# Patient Record
Sex: Female | Born: 1975 | Race: Black or African American | Hispanic: No | Marital: Single | Smoking: Never smoker
Health system: Southern US, Community
[De-identification: ages and names within clinical notes are randomized; demographics above are authoritative.]

## PROBLEM LIST (undated history)

## (undated) DIAGNOSIS — G47419 Narcolepsy without cataplexy: Secondary | ICD-10-CM

## (undated) DIAGNOSIS — I10 Essential (primary) hypertension: Secondary | ICD-10-CM

## (undated) DIAGNOSIS — F419 Anxiety disorder, unspecified: Secondary | ICD-10-CM

## (undated) DIAGNOSIS — C801 Malignant (primary) neoplasm, unspecified: Secondary | ICD-10-CM

## (undated) DIAGNOSIS — K219 Gastro-esophageal reflux disease without esophagitis: Secondary | ICD-10-CM

## (undated) DIAGNOSIS — F329 Major depressive disorder, single episode, unspecified: Secondary | ICD-10-CM

## (undated) DIAGNOSIS — F32A Depression, unspecified: Secondary | ICD-10-CM

## (undated) DIAGNOSIS — G47411 Narcolepsy with cataplexy: Principal | ICD-10-CM

## (undated) DIAGNOSIS — M1990 Unspecified osteoarthritis, unspecified site: Secondary | ICD-10-CM

## (undated) DIAGNOSIS — T7840XA Allergy, unspecified, initial encounter: Secondary | ICD-10-CM

## (undated) HISTORY — DX: Allergy, unspecified, initial encounter: T78.40XA

## (undated) HISTORY — PX: TONSILLECTOMY: SUR1361

## (undated) HISTORY — PX: CHOLECYSTECTOMY: SHX55

## (undated) HISTORY — PX: BREAST LUMPECTOMY: SHX2

## (undated) HISTORY — DX: Narcolepsy with cataplexy: G47.411

## (undated) HISTORY — DX: Malignant (primary) neoplasm, unspecified: C80.1

---

## 2004-11-11 ENCOUNTER — Emergency Department (HOSPITAL_COMMUNITY): Admission: EM | Admit: 2004-11-11 | Discharge: 2004-11-11 | Payer: Self-pay

## 2013-11-07 ENCOUNTER — Emergency Department (HOSPITAL_COMMUNITY)
Admission: EM | Admit: 2013-11-07 | Discharge: 2013-11-07 | Disposition: A | Discharge status: Home / Self Care | Payer: Medicare Other | Attending: Emergency Medicine | Admitting: Emergency Medicine

## 2013-11-07 ENCOUNTER — Emergency Department (HOSPITAL_COMMUNITY): Payer: Medicare Other

## 2013-11-07 ENCOUNTER — Other Ambulatory Visit: Payer: Self-pay

## 2013-11-07 ENCOUNTER — Encounter (HOSPITAL_COMMUNITY): Payer: Self-pay | Admitting: Emergency Medicine

## 2013-11-07 DIAGNOSIS — I10 Essential (primary) hypertension: Secondary | ICD-10-CM | POA: Insufficient documentation

## 2013-11-07 DIAGNOSIS — M542 Cervicalgia: Secondary | ICD-10-CM | POA: Insufficient documentation

## 2013-11-07 DIAGNOSIS — Z8669 Personal history of other diseases of the nervous system and sense organs: Secondary | ICD-10-CM | POA: Insufficient documentation

## 2013-11-07 DIAGNOSIS — Z8659 Personal history of other mental and behavioral disorders: Secondary | ICD-10-CM | POA: Insufficient documentation

## 2013-11-07 DIAGNOSIS — M25511 Pain in right shoulder: Secondary | ICD-10-CM

## 2013-11-07 DIAGNOSIS — M25519 Pain in unspecified shoulder: Secondary | ICD-10-CM | POA: Insufficient documentation

## 2013-11-07 HISTORY — DX: Depression, unspecified: F32.A

## 2013-11-07 HISTORY — DX: Narcolepsy without cataplexy: G47.419

## 2013-11-07 HISTORY — DX: Major depressive disorder, single episode, unspecified: F32.9

## 2013-11-07 HISTORY — DX: Anxiety disorder, unspecified: F41.9

## 2013-11-07 HISTORY — DX: Essential (primary) hypertension: I10

## 2013-11-07 HISTORY — DX: Unspecified osteoarthritis, unspecified site: M19.90

## 2013-11-07 MED ORDER — TRAMADOL HCL 50 MG PO TABS: 50.0000 mg | ORAL_TABLET | Freq: Four times a day (QID) | ORAL | Status: DC | PRN

## 2013-11-07 MED ORDER — HYDROMORPHONE HCL PF 1 MG/ML IJ SOLN
1.0000 mg | Freq: Once | INTRAMUSCULAR | Status: AC
  Administered 2013-11-07: 1 mg via INTRAMUSCULAR
  Filled 2013-11-07: qty 1

## 2013-11-07 MED ORDER — CYCLOBENZAPRINE HCL 10 MG PO TABS: 10.0000 mg | ORAL_TABLET | Freq: Two times a day (BID) | ORAL | Status: DC | PRN

## 2013-11-07 MED ORDER — IBUPROFEN 800 MG PO TABS: 800.0000 mg | ORAL_TABLET | Freq: Three times a day (TID) | ORAL | Status: DC

## 2013-11-07 MED ORDER — ONDANSETRON 4 MG PO TBDP
4.0000 mg | ORAL_TABLET | Freq: Once | ORAL | Status: AC
  Administered 2013-11-07: 4 mg via ORAL
  Filled 2013-11-07: qty 1

## 2013-11-07 NOTE — ED Notes (Signed)
Patient with right arm pain, it started bothering her a few days ago.  Patient states she is unable to sleep due to the pain.  Patient denies any injury to the area.  Patient denies any swelling.

## 2013-11-07 NOTE — ED Provider Notes (Signed)
CSN: [id number]     Arrival date & time 11/07/13  4098 History   First MD Initiated Contact with Patient 11/07/13 [phone]     Chief Complaint  Patient presents with  . Arm Pain   (Consider location/radiation/quality/duration/timing/severity/associated sxs/prior Treatment) HPI History provided to patient. Right shoulder pain and right neck pain worse with movement and ongoing over the last month. Symptoms seem to be getting worse. Pain radiates from her shoulder down to her wrist, is sharp in quality and moderate in severity. Tonight having trouble sleeping due to pain. She denies any trauma, fall, heavy lifting or recalled injury. No swelling. No weakness or numbness. No history of same otherwise.  Past Medical History  Diagnosis Date  . Hypertension   . Narcolepsy   . Osteoarthritis   . Anxiety   . Depression    Past Surgical History  Procedure Laterality Date  . Tonsillectomy    . Cholecystectomy    . Breast lumpectomy     No family history on file. History  Substance Use Topics  . Smoking status: Never Smoker   . Smokeless tobacco: Not on file  . Alcohol Use: No   OB History   Grav Para Term Preterm Abortions TAB SAB Ect Mult Living                 Review of Systems  Constitutional: Negative for fever and chills.  Respiratory: Negative for shortness of breath.   Cardiovascular: Negative for chest pain.  Gastrointestinal: Negative for vomiting and abdominal pain.  Genitourinary: Negative for flank pain.  Musculoskeletal: Positive for neck pain. Negative for back pain and neck stiffness.  Skin: Negative for rash.  Neurological: Negative for weakness and numbness.  All other systems reviewed and are negative.    Allergies  Review of patient's allergies indicates no known allergies.  Home Medications  No current outpatient prescriptions on file. BP 111/91  Pulse 80  Temp(Src) 97.5 F (36.4 C) (Oral)  Resp 20  SpO2 99%  LMP 08/08/2013 Physical Exam   Constitutional: She is oriented to person, place, and time. She appears well-developed and well-nourished.  HENT:  Head: Normocephalic and atraumatic.  Eyes: EOM are normal. Pupils are equal, round, and reactive to light.  Neck: Neck supple.  No C-spine tenderness or deformity. She is tender over the right trapezius without swelling  Cardiovascular: Regular rhythm and intact distal pulses.   Pulmonary/Chest: Effort normal and breath sounds normal. No respiratory distress. She exhibits no tenderness.  Abdominal: Soft. She exhibits no distension. There is no tenderness.  Musculoskeletal: Normal range of motion. She exhibits no edema.  Tender over the right shoulder without deformity. Equal grips, triceps biceps. Sensorium to light touch equal and intact throughout.  good range of motion at her shoulder with reproducible symptoms.  Neurological: She is alert and oriented to person, place, and time.  Skin: Skin is warm and dry.    ED Course  Procedures (including critical care time) Labs Review Labs Reviewed - No data to display Imaging Review Dg Cervical Spine Complete  11/07/2013   CLINICAL DATA:  Worsening right-sided neck pain for several weeks.  EXAM: CERVICAL SPINE  4+ VIEWS  COMPARISON:  Cervical spine radiographs performed 04/15/2011  FINDINGS: There is no evidence of fracture or subluxation. Vertebral bodies demonstrate normal height and alignment. Small anterior disc osteophyte complexes are seen along the lower cervical spine. Intervertebral disc spaces are preserved. Prevertebral soft tissues are within normal limits. The provided odontoid view demonstrates no  significant abnormality.  The visualized lung apices are clear.  IMPRESSION: No evidence of fracture or subluxation along the cervical spine.   Electronically Signed   By: Roanna Raider M.D.   On: 11/07/2013 04:55   Dg Shoulder Right  11/07/2013   CLINICAL DATA:  Worsening right shoulder pain for several weeks.  EXAM: RIGHT  SHOULDER - 2+ VIEW  COMPARISON:  Right shoulder radiographs performed 04/15/2011  FINDINGS: There is no evidence of fracture or dislocation. The right humeral head is seated within the glenoid fossa. The acromioclavicular joint is unremarkable in appearance. No significant soft tissue abnormalities are seen. The visualized portions of the right lung are clear.  IMPRESSION: No evidence of fracture or dislocation.   Electronically Signed   By: Roanna Raider M.D.   On: 11/07/2013 04:54    Date: 11/07/2013  Rate: 71  Rhythm: normal sinus rhythm  QRS Axis: normal  Intervals: normal  ST/T Wave abnormalities: nonspecific ST changes  Conduction Disutrbances:none  Narrative Interpretation:   Old EKG Reviewed: none available  Dilaudid IM provided. Patient requesting x-ray of her shoulder.  Sling provided.  5:36 AM recheck pain improved. plan discharge home with prescription for Motrin, Ultram. Orthopedic referral provided for any persistent symptoms. Patient agrees to rest, ice and elevation. Return precautions provided.  MDM  Diagnosis: Right shoulder pain, history of osteoarthritis  Evaluated with x-rays reviewed as above. Symptoms improved with IM narcotics Sling provided Vital signs and nurses notes reviewed and considered    Sunnie Nielsen, MD 11/07/13 [phone]

## 2013-11-07 NOTE — ED Notes (Signed)
Patient returned from xray.

## 2013-12-02 ENCOUNTER — Encounter (HOSPITAL_COMMUNITY): Payer: Self-pay | Admitting: Emergency Medicine

## 2013-12-02 ENCOUNTER — Emergency Department (HOSPITAL_COMMUNITY)
Admission: EM | Admit: 2013-12-02 | Discharge: 2013-12-02 | Disposition: A | Discharge status: Home / Self Care | Payer: Medicare Other | Attending: Emergency Medicine | Admitting: Emergency Medicine

## 2013-12-02 DIAGNOSIS — M549 Dorsalgia, unspecified: Secondary | ICD-10-CM | POA: Insufficient documentation

## 2013-12-02 DIAGNOSIS — R51 Headache: Secondary | ICD-10-CM | POA: Insufficient documentation

## 2013-12-02 DIAGNOSIS — M1990 Unspecified osteoarthritis, unspecified site: Secondary | ICD-10-CM | POA: Insufficient documentation

## 2013-12-02 DIAGNOSIS — Z8669 Personal history of other diseases of the nervous system and sense organs: Secondary | ICD-10-CM | POA: Insufficient documentation

## 2013-12-02 DIAGNOSIS — F3289 Other specified depressive episodes: Secondary | ICD-10-CM | POA: Insufficient documentation

## 2013-12-02 DIAGNOSIS — F329 Major depressive disorder, single episode, unspecified: Secondary | ICD-10-CM | POA: Insufficient documentation

## 2013-12-02 DIAGNOSIS — Z791 Long term (current) use of non-steroidal anti-inflammatories (NSAID): Secondary | ICD-10-CM | POA: Insufficient documentation

## 2013-12-02 DIAGNOSIS — M542 Cervicalgia: Secondary | ICD-10-CM | POA: Insufficient documentation

## 2013-12-02 DIAGNOSIS — F411 Generalized anxiety disorder: Secondary | ICD-10-CM | POA: Insufficient documentation

## 2013-12-02 DIAGNOSIS — Z79899 Other long term (current) drug therapy: Secondary | ICD-10-CM | POA: Insufficient documentation

## 2013-12-02 DIAGNOSIS — I10 Essential (primary) hypertension: Secondary | ICD-10-CM | POA: Insufficient documentation

## 2013-12-02 MED ORDER — OXYCODONE-ACETAMINOPHEN 5-325 MG PO TABS: 1.0000 | ORAL_TABLET | ORAL | Status: DC | PRN

## 2013-12-02 MED ORDER — OXYCODONE-ACETAMINOPHEN 5-325 MG PO TABS
1.0000 | ORAL_TABLET | Freq: Once | ORAL | Status: AC
  Administered 2013-12-02: 1 via ORAL
  Filled 2013-12-02: qty 1

## 2013-12-02 NOTE — ED Notes (Signed)
Pt states that she is experiencing neck pain and back pain. Pt states that this is chronic pain. States she took Tylenol Arthritis and tried a heating pad with no decrease in pain.

## 2013-12-02 NOTE — ED Provider Notes (Signed)
CSN: [id number]     Arrival date & time 12/02/13  0305 History   First MD Initiated Contact with Patient 12/02/13 0532     Chief Complaint  Patient presents with  . Neck Pain  . Back Pain    Patient is a 37 y.o. female presenting with neck pain and back pain. The history is provided by the patient.  Neck Pain Pain location:  R side Quality:  Stiffness Pain radiates to:  R shoulder Pain severity:  Moderate Onset quality:  Gradual Timing:  Constant Progression:  Worsening Chronicity:  Recurrent Worsened by:  Position Ineffective treatments:  Heat Associated symptoms: headaches   Associated symptoms: no chest pain, no fever, no numbness and no weakness   Back Pain Associated symptoms: headaches   Associated symptoms: no chest pain, no fever, no numbness and no weakness   pt reports that she has had right sided neck pain/stiffness for one day It will move into back and shoulder No fever.  No trauma.  No weakness No cp/sob She has had this previously and seen in the ED before No h/o neck surgery   Past Medical History  Diagnosis Date  . Hypertension   . Narcolepsy   . Osteoarthritis   . Anxiety   . Depression    Past Surgical History  Procedure Laterality Date  . Tonsillectomy    . Cholecystectomy    . Breast lumpectomy     History reviewed. No pertinent family history. History  Substance Use Topics  . Smoking status: Never Smoker   . Smokeless tobacco: Not on file  . Alcohol Use: Yes     Comment: ocassionally   OB History   Grav Para Term Preterm Abortions TAB SAB Ect Mult Living                 Review of Systems  Constitutional: Negative for fever.  HENT: Negative for trouble swallowing.   Cardiovascular: Negative for chest pain.  Gastrointestinal: Negative for vomiting.  Musculoskeletal: Positive for back pain and neck pain.  Neurological: Positive for headaches. Negative for weakness and numbness.    Allergies  Review of patient's allergies  indicates no known allergies.  Home Medications   Current Outpatient Rx  Name  Route  Sig  Dispense  Refill  . amLODipine (NORVASC) 10 MG tablet   Oral   Take 10 mg by mouth daily.         . Armodafinil (NUVIGIL) 250 MG tablet   Oral   Take 250 mg by mouth daily.         Marland Kitchen atenolol (TENORMIN) 50 MG tablet   Oral   Take 50 mg by mouth daily.         Marland Kitchen buPROPion (WELLBUTRIN XL) 150 MG 24 hr tablet   Oral   Take 150 mg by mouth daily.         . celecoxib (CELEBREX) 200 MG capsule   Oral   Take 200 mg by mouth daily.         . Cholecalciferol (VITAMIN D PO)   Oral   Take 1 tablet by mouth daily.         . cyclobenzaprine (FLEXERIL) 10 MG tablet   Oral   Take 1 tablet (10 mg total) by mouth 2 (two) times daily as needed for muscle spasms.   20 tablet   0   . esomeprazole (NEXIUM) 40 MG capsule   Oral   Take 40 mg by mouth 2 (two)  times daily before a meal.         . ibuprofen (ADVIL,MOTRIN) 800 MG tablet   Oral   Take 1 tablet (800 mg total) by mouth 3 (three) times daily.   21 tablet   0   . losartan (COZAAR) 100 MG tablet   Oral   Take 100 mg by mouth daily.         Marland Kitchen oxyCODONE-acetaminophen (PERCOCET/ROXICET) 5-325 MG per tablet   Oral   Take 1 tablet by mouth every 4 (four) hours as needed for severe pain.   5 tablet   0    BP 134/76  Pulse 69  Temp(Src) 97.6 F (36.4 C) (Oral)  Resp 16  SpO2 100%  LMP 11/30/2013 Physical Exam CONSTITUTIONAL: Well developed/well nourished HEAD: Normocephalic/atraumatic EYES: EOMI/PERRL ENMT: Mucous membranes moist, no trismus NECK: supple no meningeal signs, no bruits noted SPINE:entire spine nontender, No bruising/crepitance/stepoffs noted to spine Cervical paraspinal tenderness is reproduced with rotation of neck . No erythema/masses noted.   CV: S1/S2 noted, no murmurs/rubs/gallops noted LUNGS: Lungs are clear to auscultation bilaterally, no apparent distress ABDOMEN: soft, nontender, no  rebound or guarding NEURO: Pt is awake/alert, moves all extremitiesx4 Equal power with hand grip, wrist flex/extension, elbow flex/extension, and equal power with shoulder abduction/adduction.  No focal sensory deficit is noted in either UE.   Equal biceps/brachioradial reflex in bilateral UE EXTREMITIES: pulses normal, full ROM SKIN: warm, color normal PSYCH: no abnormalities of mood noted  ED Course  Procedures (including critical care time)  Pt well appearing, no distress noted, no focal neuro deficits noted She has had imaging on previous ED visit She still has flexeril at home  She requests pain management Short course of percocet given She was advised to f/u as outpatient if this continues We discussed strict return precautions  Labs Review Labs Reviewed - No data to display Imaging Review No results found.  EKG Interpretation   None       MDM   1. Neck pain    Nursing notes including past medical history and social history reviewed and considered in documentation Previous records reviewed and considered     Joya Gaskins, MD 12/02/13 [phone]

## 2013-12-02 NOTE — ED Notes (Signed)
Pt upset about wait time - explained ED process and that provider will be in as soon as possible.

## 2014-03-03 ENCOUNTER — Encounter (HOSPITAL_COMMUNITY): Payer: Self-pay | Admitting: Emergency Medicine

## 2014-03-03 ENCOUNTER — Emergency Department (HOSPITAL_COMMUNITY): Payer: Medicare Other

## 2014-03-03 ENCOUNTER — Emergency Department (HOSPITAL_COMMUNITY)
Admission: EM | Admit: 2014-03-03 | Discharge: 2014-03-03 | Disposition: A | Discharge status: Home / Self Care | Payer: Medicare Other | Attending: Emergency Medicine | Admitting: Emergency Medicine

## 2014-03-03 DIAGNOSIS — Z8669 Personal history of other diseases of the nervous system and sense organs: Secondary | ICD-10-CM | POA: Insufficient documentation

## 2014-03-03 DIAGNOSIS — Z853 Personal history of malignant neoplasm of breast: Secondary | ICD-10-CM | POA: Insufficient documentation

## 2014-03-03 DIAGNOSIS — M25569 Pain in unspecified knee: Secondary | ICD-10-CM | POA: Insufficient documentation

## 2014-03-03 DIAGNOSIS — K6289 Other specified diseases of anus and rectum: Secondary | ICD-10-CM | POA: Insufficient documentation

## 2014-03-03 DIAGNOSIS — F329 Major depressive disorder, single episode, unspecified: Secondary | ICD-10-CM | POA: Insufficient documentation

## 2014-03-03 DIAGNOSIS — Z3202 Encounter for pregnancy test, result negative: Secondary | ICD-10-CM | POA: Insufficient documentation

## 2014-03-03 DIAGNOSIS — A499 Bacterial infection, unspecified: Secondary | ICD-10-CM | POA: Insufficient documentation

## 2014-03-03 DIAGNOSIS — K219 Gastro-esophageal reflux disease without esophagitis: Secondary | ICD-10-CM | POA: Insufficient documentation

## 2014-03-03 DIAGNOSIS — F3289 Other specified depressive episodes: Secondary | ICD-10-CM | POA: Insufficient documentation

## 2014-03-03 DIAGNOSIS — F411 Generalized anxiety disorder: Secondary | ICD-10-CM | POA: Insufficient documentation

## 2014-03-03 DIAGNOSIS — R3915 Urgency of urination: Secondary | ICD-10-CM | POA: Insufficient documentation

## 2014-03-03 DIAGNOSIS — M25561 Pain in right knee: Secondary | ICD-10-CM

## 2014-03-03 DIAGNOSIS — N760 Acute vaginitis: Secondary | ICD-10-CM | POA: Insufficient documentation

## 2014-03-03 DIAGNOSIS — B9689 Other specified bacterial agents as the cause of diseases classified elsewhere: Secondary | ICD-10-CM | POA: Insufficient documentation

## 2014-03-03 DIAGNOSIS — M1990 Unspecified osteoarthritis, unspecified site: Secondary | ICD-10-CM | POA: Insufficient documentation

## 2014-03-03 DIAGNOSIS — Z79899 Other long term (current) drug therapy: Secondary | ICD-10-CM | POA: Insufficient documentation

## 2014-03-03 DIAGNOSIS — K648 Other hemorrhoids: Secondary | ICD-10-CM | POA: Insufficient documentation

## 2014-03-03 DIAGNOSIS — I10 Essential (primary) hypertension: Secondary | ICD-10-CM | POA: Insufficient documentation

## 2014-03-03 DIAGNOSIS — R110 Nausea: Secondary | ICD-10-CM | POA: Insufficient documentation

## 2014-03-03 DIAGNOSIS — M79609 Pain in unspecified limb: Secondary | ICD-10-CM

## 2014-03-03 HISTORY — DX: Gastro-esophageal reflux disease without esophagitis: K21.9

## 2014-03-03 LAB — CBC WITH DIFFERENTIAL/PLATELET
Basophils Absolute: 0 10*3/uL (ref 0.0–0.1)
Basophils Relative: 0 % (ref 0–1)
Eosinophils Absolute: 0.1 10*3/uL (ref 0.0–0.7)
Eosinophils Relative: 1 % (ref 0–5)
HCT: 34.9 % — ABNORMAL LOW (ref 36.0–46.0)
Hemoglobin: 11.6 g/dL — ABNORMAL LOW (ref 12.0–15.0)
Lymphocytes Relative: 39 % (ref 12–46)
Lymphs Abs: 2.8 10*3/uL (ref 0.7–4.0)
MCH: 27.7 pg (ref 26.0–34.0)
MCHC: 33.2 g/dL (ref 30.0–36.0)
MCV: 83.3 fL (ref 78.0–100.0)
Monocytes Absolute: 0.5 10*3/uL (ref 0.1–1.0)
Monocytes Relative: 7 % (ref 3–12)
Neutro Abs: 3.9 10*3/uL (ref 1.7–7.7)
Neutrophils Relative %: 53 % (ref 43–77)
Platelets: 280 10*3/uL (ref 150–400)
RBC: 4.19 MIL/uL (ref 3.87–5.11)
RDW: 12.9 % (ref 11.5–15.5)
WBC: 7.3 10*3/uL (ref 4.0–10.5)

## 2014-03-03 LAB — BASIC METABOLIC PANEL
BUN: 15 mg/dL (ref 6–23)
CO2: 26 mEq/L (ref 19–32)
Calcium: 8.8 mg/dL (ref 8.4–10.5)
Chloride: 105 mEq/L (ref 96–112)
Creatinine, Ser: 0.84 mg/dL (ref 0.50–1.10)
GFR calc Af Amer: >90 mL/min (ref >90)
GFR calc non Af Amer: 87 mL/min — ABNORMAL LOW (ref >90)
Glucose, Bld: 92 mg/dL (ref 70–99)
Potassium: 3.9 mEq/L (ref 3.7–5.3)
Sodium: 141 mEq/L (ref 137–147)

## 2014-03-03 LAB — WET PREP, GENITAL
Trich, Wet Prep: NONE SEEN
WBC, Wet Prep HPF POC: NONE SEEN
Yeast Wet Prep HPF POC: NONE SEEN

## 2014-03-03 LAB — URINALYSIS, ROUTINE W REFLEX MICROSCOPIC
Bilirubin Urine: NEGATIVE
Glucose, UA: NEGATIVE mg/dL
Hgb urine dipstick: NEGATIVE
Ketones, ur: NEGATIVE mg/dL
Leukocytes, UA: NEGATIVE
Nitrite: NEGATIVE
Protein, ur: NEGATIVE mg/dL
Specific Gravity, Urine: 1.033 — ABNORMAL HIGH (ref 1.005–1.030)
Urobilinogen, UA: 0.2 mg/dL (ref 0.0–1.0)
pH: 6 (ref 5.0–8.0)

## 2014-03-03 LAB — POC URINE PREG, ED: Preg Test, Ur: NEGATIVE

## 2014-03-03 LAB — PREGNANCY, URINE: Preg Test, Ur: NEGATIVE

## 2014-03-03 MED ORDER — ONDANSETRON 4 MG PO TBDP
8.0000 mg | ORAL_TABLET | Freq: Once | ORAL | Status: AC
  Administered 2014-03-03: 8 mg via ORAL
  Filled 2014-03-03: qty 2

## 2014-03-03 MED ORDER — OXYCODONE-ACETAMINOPHEN 5-325 MG PO TABS
2.0000 | ORAL_TABLET | Freq: Once | ORAL | Status: AC
  Administered 2014-03-03: 2 via ORAL
  Filled 2014-03-03: qty 2

## 2014-03-03 MED ORDER — TRAMADOL HCL 50 MG PO TABS: 50.0000 mg | ORAL_TABLET | Freq: Four times a day (QID) | ORAL | Status: DC | PRN

## 2014-03-03 MED ORDER — METRONIDAZOLE 500 MG PO TABS
500.0000 mg | ORAL_TABLET | Freq: Two times a day (BID) | ORAL | Status: DC
Start: 2014-03-03 — End: 2014-04-12

## 2014-03-03 MED ORDER — HYDROCORTISONE ACETATE 25 MG RE SUPP: 25.0000 mg | Freq: Two times a day (BID) | RECTAL | Status: DC

## 2014-03-03 NOTE — Discharge Instructions (Signed)
Bacterial Vaginosis Bacterial vaginosis is an infection of the vagina. It happens when too many of certain germs (bacteria) grow in the vagina. HOME CARE  Take your medicine as told by your doctor.  Finish your medicine even if you start to feel better.  Do not have sex until you finish your medicine and are better.  Tell your sex partner that you have an infection. They should see their doctor for treatment.  Practice safe sex. Use condoms. Have only one sex partner. GET HELP IF:  You are not getting better after 3 days of treatment.  You have more grey fluid (discharge) coming from your vagina than before.  You have more pain than before.  You have a fever. MAKE SURE YOU:   Understand these instructions.  Will watch your condition.  Will get help right away if you are not doing well or get worse. Document Released: 08/28/2008 Document Revised: 09/09/2013 Document Reviewed: 07/01/2013 [employer] Patient Information 2014 [employer].  

## 2014-03-03 NOTE — Progress Notes (Signed)
Right lower extremity venous duplex completed.  Right:  No evidence of DVT, superficial thrombosis, or Baker's cyst.  Left:  Negative for DVT in the common femoral vein.  

## 2014-03-03 NOTE — ED Provider Notes (Signed)
Medical Screening Exam   Pt placed in fast track for right knee pain, but also is complaining of rectal and lower abdominal pain.  Tender across lower abdomen.  No guarding, no rebound. Pt to be moved to main ED.      Clayton Bibles, PA-C 03/03/14 1456

## 2014-03-03 NOTE — ED Notes (Signed)
PA ordered pt moved to acute side.

## 2014-03-03 NOTE — ED Notes (Signed)
Vascular at bedside completing dopplar

## 2014-03-03 NOTE — ED Notes (Signed)
Rt knee pain since last night denies injury and rectal pain was told she has internal hemorrhoids denies drainage

## 2014-03-03 NOTE — ED Notes (Signed)
C/O right knee pain since yesterday. "Feels like it's slipping". Also, c/o rectal pain, "I've been told I have internal hemorrhoids". Denies bleeding.

## 2014-03-03 NOTE — ED Provider Notes (Signed)
CSN: [id number]     Arrival date & time 03/03/14  1400 History   First MD Initiated Contact with Patient 03/03/14 1447     Chief Complaint  Patient presents with  . Knee Pain  . Rectal Pain     (Consider location/radiation/quality/duration/timing/severity/associated sxs/prior Treatment) HPI Comments: Patient is a 38 year old female with history of hypertension, narcolepsy, osteoarthritis, anxiety, depression, acid reflux, and breast cancer who presents today with rectal pain and right knee pain. She was walking yesterday when she felt her knee "slip". Since that time she has had pain laterally in her right knee. Walking seems to make the pain worse. She has not tried anything to make the pain better. She has had rectal pain since earlier today. It is a sharp pain that radiates from her anus to her rectum. At her last colonoscopy she was told she has internal hemorrhoids and is not sure if this is what is causing her pain. She admits to sitting on the toilet and straining for long periods of time. She denies BRBPR and melena. Bowel movements do not make this pain worse. She has associated nausea. She denies fevers, chills, vomiting, vaginal discharge, vaginal bleeding.   The history is provided by the patient. No language interpreter was used.    Past Medical History  Diagnosis Date  . Hypertension   . Narcolepsy   . Osteoarthritis   . Anxiety   . Depression   . Acid reflux    Past Surgical History  Procedure Laterality Date  . Tonsillectomy    . Cholecystectomy    . Breast lumpectomy     No family history on file. History  Substance Use Topics  . Smoking status: Never Smoker   . Smokeless tobacco: Not on file  . Alcohol Use: Yes     Comment: ocassionally   OB History   Grav Para Term Preterm Abortions TAB SAB Ect Mult Living                 Review of Systems  Constitutional: Negative for fever and chills.  Respiratory: Negative for shortness of breath.   Cardiovascular:  Negative for chest pain.  Gastrointestinal: Positive for nausea and abdominal pain. Negative for vomiting, diarrhea, constipation, blood in stool and anal bleeding.       Increased straining  Genitourinary: Positive for urgency. Negative for dysuria.  All other systems reviewed and are negative.      Allergies  Review of patient's allergies indicates no known allergies.  Home Medications   Current Outpatient Rx  Name  Route  Sig  Dispense  Refill  . acetaminophen (TYLENOL) 650 MG CR tablet   Oral   Take 650 mg by mouth every 8 (eight) hours as needed for pain.         Marland Kitchen amLODipine (NORVASC) 10 MG tablet   Oral   Take 10 mg by mouth daily.         . Armodafinil (NUVIGIL) 250 MG tablet   Oral   Take 250 mg by mouth daily.         Marland Kitchen atenolol (TENORMIN) 50 MG tablet   Oral   Take 50 mg by mouth daily.         Marland Kitchen buPROPion (WELLBUTRIN XL) 150 MG 24 hr tablet   Oral   Take 150 mg by mouth daily.         . celecoxib (CELEBREX) 200 MG capsule   Oral   Take 200 mg by  mouth daily.         . Cholecalciferol (VITAMIN D PO)   Oral   Take 1 tablet by mouth daily.         . cycloSPORINE (RESTASIS) 0.05 % ophthalmic emulsion   Both Eyes   Place 1 drop into both eyes 2 (two) times daily.         Marland Kitchen esomeprazole (NEXIUM) 40 MG capsule   Oral   Take 40 mg by mouth 2 (two) times daily before a meal.         . losartan (COZAAR) 100 MG tablet   Oral   Take 100 mg by mouth daily.         . Multiple Vitamins-Minerals (MULTIVITAMIN WITH MINERALS) tablet   Oral   Take 1 tablet by mouth daily.         . traZODone (DESYREL) 50 MG tablet   Oral   Take 50 mg by mouth at bedtime as needed for sleep.          BP 156/90  Pulse 78  Temp(Src) 98.4 F (36.9 C) (Oral)  Resp 20  SpO2 98% Physical Exam  Nursing note and vitals reviewed. Constitutional: She is oriented to person, place, and time. She appears well-developed and well-nourished. No distress.   HENT:  Head: Normocephalic and atraumatic.  Right Ear: External ear normal.  Left Ear: External ear normal.  Nose: Nose normal.  Mouth/Throat: Oropharynx is clear and moist.  Eyes: Conjunctivae are normal.  Neck: Normal range of motion.  Cardiovascular: Normal rate, regular rhythm and normal heart sounds.   Pulmonary/Chest: Effort normal and breath sounds normal. No stridor. No respiratory distress. She has no wheezes. She has no rales.  Abdominal: Soft. Bowel sounds are normal. She exhibits no distension. There is no tenderness. There is no rebound.  Genitourinary: There is no rash, tenderness, lesion or injury on the right labia. There is no rash, tenderness, lesion or injury on the left labia. Cervix exhibits discharge. Cervix exhibits no motion tenderness and no friability. Right adnexum displays no mass, no tenderness and no fullness. Left adnexum displays tenderness (mild). Left adnexum displays no mass and no fullness. No erythema, tenderness or bleeding around the vagina. No foreign body around the vagina. No signs of injury around the vagina. Vaginal discharge found.  White vaginal discharge  Musculoskeletal: Normal range of motion.  Tender to palpation over lateral aspect of right knee   Neurological: She is alert and oriented to person, place, and time. She has normal strength.  Skin: Skin is warm and dry. She is not diaphoretic. No erythema.  Psychiatric: She has a normal mood and affect. Her behavior is normal.    ED Course  Procedures (including critical care time) Labs Review Labs Reviewed  WET PREP, GENITAL - Abnormal; Notable for the following:    Clue Cells Wet Prep HPF POC FEW (*)    All other components within normal limits  CBC WITH DIFFERENTIAL - Abnormal; Notable for the following:    Hemoglobin 11.6 (*)    HCT 34.9 (*)    All other components within normal limits  BASIC METABOLIC PANEL - Abnormal; Notable for the following:    GFR calc non Af Amer 87 (*)     All other components within normal limits  URINALYSIS, ROUTINE W REFLEX MICROSCOPIC - Abnormal; Notable for the following:    APPearance CLOUDY (*)    Specific Gravity, Urine 1.033 (*)    All other components within normal  limits  GC/CHLAMYDIA PROBE AMP  PREGNANCY, URINE  POC URINE PREG, ED   Imaging Review Dg Knee Complete 4 Views Right  03/03/2014   CLINICAL DATA:  Right need weakness.  EXAM: RIGHT KNEE - COMPLETE 4+ VIEW  COMPARISON:  None.  FINDINGS: No fracture. No bone lesion. There is narrowing of the patellofemoral joint space compartment. Marginal osteophytes are noted from all 3 compartments. There is ossification posterior to the knee most likely in the soft tissues. This could be in a Baker cyst.  No joint effusion.  Soft tissues are otherwise unremarkable.  IMPRESSION: No fracture or acute finding.  Degenerative changes most evident along the patellofemoral joint space compartment.   Electronically Signed   By: Lajean Manes M.D.   On: 03/03/2014 16:18     EKG Interpretation None      MDM   Final diagnoses:  Right knee pain  Bacterial vaginosis  Rectal pain   Patient presents to ED with right knee pain, lower abdominal pain, and rectal pain. Her right knee feels unstable after walking. Patient has not fallen. XR shows no acute abnormality, venous duplex shows no DVT or Baker's cyst. Patient is neurovascularly intact, compartment soft, joint stable. Patient given knee sleeve and encouraged RICE. Patient with internal hemorrhoids. She was given Anusol and encouraged to stop straining. There is no evidence on exam of perianal or perirectal abscess. Patient has lower abdominal discomfort. She was found to have few clue cells on wet prep. Will tx for BV. No CMT on pelvic exam. No concern for PID, ectopic pregnancy, or ovarian torsion. Discussed reasons to return to the ED. Vital signs stable for discharge. Discussed case with Dr. Doy Mince who agrees with plan. Patient / Family /  Caregiver informed of clinical course, understand medical decision-making process, and agree with plan.     Elwyn Lade, PA-C 03/04/14 [phone]

## 2014-03-04 LAB — GC/CHLAMYDIA PROBE AMP
CT Probe RNA: NEGATIVE
GC Probe RNA: NEGATIVE

## 2014-03-05 NOTE — ED Provider Notes (Signed)
Medical screening examination/treatment/procedure(s) were performed by non-physician practitioner and as supervising physician I was immediately available for consultation/collaboration.   Houston Siren III, MD 03/05/14 [phone]

## 2014-03-05 NOTE — ED Provider Notes (Signed)
Medical screening examination/treatment/procedure(s) were performed by non-physician practitioner and as supervising physician I was immediately available for consultation/collaboration.   EKG Interpretation None        Mervin Kung, MD 03/05/14 1335

## 2014-04-12 ENCOUNTER — Encounter: Payer: Self-pay | Admitting: Obstetrics & Gynecology

## 2014-04-12 ENCOUNTER — Ambulatory Visit (INDEPENDENT_AMBULATORY_CARE_PROVIDER_SITE_OTHER): Payer: Medicare Other | Admitting: Obstetrics & Gynecology

## 2014-04-12 VITALS — BP 133/81 | HR 76 | Temp 98.0°F | Ht 64.0 in | Wt 244.0 lb

## 2014-04-12 DIAGNOSIS — N951 Menopausal and female climacteric states: Secondary | ICD-10-CM

## 2014-04-13 ENCOUNTER — Encounter: Payer: Self-pay | Admitting: Obstetrics & Gynecology

## 2014-04-14 ENCOUNTER — Other Ambulatory Visit: Payer: Self-pay | Admitting: *Deleted

## 2014-04-14 DIAGNOSIS — E288 Other ovarian dysfunction: Principal | ICD-10-CM

## 2014-04-14 DIAGNOSIS — E2839 Other primary ovarian failure: Secondary | ICD-10-CM

## 2014-04-14 NOTE — Progress Notes (Signed)
Patient ID: Robin Russo, female   DOB: 1975/12/18, 38 y.o.   MRN: [id number]  C/O menopausal symptoms  HPI Robin Russo is a 38 y.o. female   Other This is a chronic problem. Episode onset: She has a h/o breast cancer.  The hot flushes started after chemotherapy. The problem occurs 2 to 4 times per day. The problem has been unchanged. Associated symptoms comments: Vaginal dryness. Treatments tried: ssri/snri.         Past Medical History  Diagnosis Date  . Hypertension   . Narcolepsy   . Osteoarthritis   . Anxiety   . Depression   . Acid reflux   . Allergy     Past Surgical History  Procedure Laterality Date  . Tonsillectomy    . Cholecystectomy    . Breast lumpectomy      Family History  Problem Relation Age of Onset  . Heart disease Mother   . Diabetes Mother   . Hypertension Mother   . Hyperlipidemia Mother   . Cataracts Mother   . Stroke Mother   . Arthritis Mother   . Heart disease Father   . Hypertension Father   . Hyperlipidemia Father   . Diabetes Father   . Stroke Father   . Arthritis Father   . Cataracts Father     Social History History  Substance Use Topics  . Smoking status: Never Smoker   . Smokeless tobacco: Never Used  . Alcohol Use: Yes     Comment: ocassionally    No Known Allergies  Current Outpatient Prescriptions  Medication Sig Dispense Refill  . acetaminophen (TYLENOL) 650 MG CR tablet Take 650 mg by mouth every 8 (eight) hours as needed for pain.      Marland Kitchen ALPRAZolam (XANAX) 0.25 MG tablet Take 0.25 mg by mouth at bedtime as needed for anxiety.      Marland Kitchen amLODipine (NORVASC) 10 MG tablet Take 10 mg by mouth daily.      . Armodafinil (NUVIGIL) 250 MG tablet Take 250 mg by mouth daily.      Marland Kitchen atenolol (TENORMIN) 50 MG tablet Take 50 mg by mouth daily.      Marland Kitchen buPROPion (WELLBUTRIN XL) 150 MG 24 hr tablet Take 150 mg by mouth daily.      . celecoxib (CELEBREX) 200 MG capsule Take 200 mg by mouth daily.      .  Cholecalciferol (VITAMIN D PO) Take 1 tablet by mouth daily.      . cycloSPORINE (RESTASIS) 0.05 % ophthalmic emulsion Place 1 drop into both eyes 2 (two) times daily.      Marland Kitchen esomeprazole (NEXIUM) 40 MG capsule Take 40 mg by mouth 2 (two) times daily before a meal.      . HYDROcodone-acetaminophen (NORCO/VICODIN) 5-325 MG per tablet Take 1 tablet by mouth every 6 (six) hours as needed for moderate pain.      Marland Kitchen ibuprofen (ADVIL,MOTRIN) 800 MG tablet Take 800 mg by mouth every 8 (eight) hours as needed.      Marland Kitchen losartan (COZAAR) 100 MG tablet Take 100 mg by mouth daily.      . metFORMIN (GLUCOPHAGE) 500 MG tablet Take by mouth 2 (two) times daily with a meal.      . Multiple Vitamins-Minerals (MULTIVITAMIN WITH MINERALS) tablet Take 1 tablet by mouth daily.      . Salicylic Acid-Cleanser 6 % (CREAM) KIT Apply topically.      . traMADol (ULTRAM) 50 MG tablet Take 1  tablet (50 mg total) by mouth every 6 (six) hours as needed.  15 tablet  0  . traZODone (DESYREL) 50 MG tablet Take 50 mg by mouth at bedtime as needed for sleep.       No current facility-administered medications for this visit.    Review of Systems Review of Systems Constitutional: negative for fatigue and weight loss Respiratory: negative for cough and wheezing Cardiovascular: negative for chest pain, fatigue and palpitations Gastrointestinal: negative for abdominal pain and change in bowel habits Genitourinary:positive for vaginal dryness Musculoskeletal:negative for myalgias Neurological: negative for gait problems and tremors Behavioral/Psych: negative for abusive relationship, depression Endocrine: positive for temperature intolerance     Blood pressure 133/81, pulse 76, temperature 98 F (36.7 C), height 5' 4"  (1.626 m), weight 110.678 kg (244 lb), last menstrual period 03/23/2014.  Physical Exam Physical Exam   50% of 15 min visit spent on counseling and coordination of care.   Data Reviewed None  Assessment     Menopausal symptoms--POF s/p chemotherapy    Plan   Non-hormonal treatments reviewed--unable to tolerate anti seizure medications, vaginal moisturizers, water soluble lubricants, behavioral modification Maintain bone health No orders of the defined types were placed in this encounter.   Meds ordered this encounter  Medications  . Salicylic Acid-Cleanser 6 % (CREAM) KIT    Sig: Apply topically.  Marland Kitchen ibuprofen (ADVIL,MOTRIN) 800 MG tablet    Sig: Take 800 mg by mouth every 8 (eight) hours as needed.  . metFORMIN (GLUCOPHAGE) 500 MG tablet    Sig: Take by mouth 2 (two) times daily with a meal.  . ALPRAZolam (XANAX) 0.25 MG tablet    Sig: Take 0.25 mg by mouth at bedtime as needed for anxiety.  Marland Kitchen HYDROcodone-acetaminophen (NORCO/VICODIN) 5-325 MG per tablet    Sig: Take 1 tablet by mouth every 6 (six) hours as needed for moderate pain.   BMD Follow up as needed.         Lahoma Crocker 04/15/2014, 12:10 AM

## 2014-04-15 DIAGNOSIS — N951 Menopausal and female climacteric states: Secondary | ICD-10-CM | POA: Insufficient documentation

## 2014-04-15 NOTE — Patient Instructions (Signed)
Bone Health Our bones do many things. They provide structure, protect organs, anchor muscles, and store calcium. Adequate calcium in your diet and weight-bearing physical activity help build strong bones, improve bone amounts, and may reduce the risk of weakening of bones (osteoporosis) later in life. PEAK BONE MASS By age 39, the average woman has acquired most of her skeletal bone mass. A large decline occurs in older adults which increases the risk of osteoporosis. In women this occurs around the time of menopause. It is important for young girls to reach their peak bone mass in order to maintain bone health throughout life. A person with high bone mass as a young adult will be more likely to have a higher bone mass later in life. Not enough calcium consumption and physical activity early on could result in a failure to achieve optimum bone mass in adulthood. OSTEOPOROSIS Osteoporosis is a disease of the bones. It is defined as low bone mass with deterioration of bone structure. Osteoporosis leads to an increase risk of fractures with falls. These fractures commonly happen in the wrist, hip, and spine. While men and women of all ages and background can develop osteoporosis, some of the risk factors for osteoporosis are:  Female.  White.  Postmenopausal.  Older adults.  Small in body size.  Eating a diet low in calcium.  Physically inactive.  Smoking.  Use of some medications.  Family history. CALCIUM Calcium is a mineral needed by the body for healthy bones, teeth, and proper function of the heart, muscles, and nerves. The body cannot produce calcium so it must be absorbed through food. Good sources of calcium include:  Dairy products (low fat or nonfat milk, cheese, and yogurt).  Dark green leafy vegetables (bok choy and broccoli).  Calcium fortified foods (orange juice, cereal, bread, soy beverages, and tofu products).  Nuts (almonds). Recommended amounts of calcium vary  for individuals. RECOMMENDED CALCIUM INTAKES Age and Amount in mg per day  Children 1 to 3 years / 700 mg  Children 4 to 8 years / 1,000 mg  Children 9 to 13 years / 1,300 mg  Teens 14 to 18 years / 1,300 mg  Adults 19 to 50 years / 1,000 mg  Adult women 51 to 70 years / 1,200 mg  Adults 71 years and older / 1,200 mg  Pregnant and breastfeeding teens / 1,300 mg  Pregnant and breastfeeding adults / 1,000 mg Vitamin D also plays an important role in healthy bone development. Vitamin D helps in the absorption of calcium. WEIGHT-BEARING PHYSICAL ACTIVITY Regular physical activity has many positive health benefits. Benefits include strong bones. Weight-bearing physical activity early in life is important in reaching peak bone mass. Weight-bearing physical activities cause muscles and bones to work against gravity. Some examples of weight bearing physical activities include:  Walking, jogging, or running.  Field Hockey.  Jumping rope.  Dancing.  Soccer.  Tennis or Racquetball.  Stair climbing.  Basketball.  Hiking.  Weight lifting.  Aerobic fitness classes. Including weight-bearing physical activity into an exercise plan is a great way to keep bones healthy. Adults: Engage in at least 30 minutes of moderate physical activity on most, preferably all, days of the week. Children: Engage in at least 60 minutes of moderate physical activity on most, preferably all, days of the week. FOR MORE INFORMATION [place] Department of Agriculture, Center for Nutrition Policy and Promotion: www.cnpp.usda.gov National Osteoporosis Foundation: www.nof.org Document Released: 02/09/2004 Document Revised: 03/16/2013 Document Reviewed: 05/11/2009 Exit[employer] Patient Information   2014 [place], [place].

## 2014-04-23 ENCOUNTER — Other Ambulatory Visit: Payer: Self-pay | Admitting: Obstetrics & Gynecology

## 2014-04-23 DIAGNOSIS — E289 Ovarian dysfunction, unspecified: Secondary | ICD-10-CM

## 2014-04-29 ENCOUNTER — Other Ambulatory Visit: Payer: Self-pay | Admitting: *Deleted

## 2014-04-29 DIAGNOSIS — E2839 Other primary ovarian failure: Secondary | ICD-10-CM

## 2014-04-29 DIAGNOSIS — E289 Ovarian dysfunction, unspecified: Secondary | ICD-10-CM

## 2014-07-06 ENCOUNTER — Other Ambulatory Visit: Payer: Self-pay | Admitting: Specialist

## 2014-07-06 DIAGNOSIS — N644 Mastodynia: Secondary | ICD-10-CM

## 2014-07-14 ENCOUNTER — Ambulatory Visit: Payer: Medicare Other | Admitting: Obstetrics & Gynecology

## 2014-07-21 ENCOUNTER — Ambulatory Visit
Admission: RE | Admit: 2014-07-21 | Discharge: 2014-07-21 | Disposition: A | Discharge status: Home / Self Care | Payer: Medicare Other | Source: Ambulatory Visit | Attending: Specialist | Admitting: Specialist

## 2014-07-21 DIAGNOSIS — N644 Mastodynia: Secondary | ICD-10-CM

## 2014-07-26 ENCOUNTER — Ambulatory Visit: Payer: Medicare Other | Admitting: Obstetrics & Gynecology

## 2014-09-23 IMAGING — CR DG KNEE COMPLETE 4+V*R*
4 series · 4 of 4 positions shown · non-contrast
Comparison: None.

CLINICAL DATA: Right need weakness.

EXAM:
RIGHT KNEE - COMPLETE 4+ VIEW

[t knee ap right]
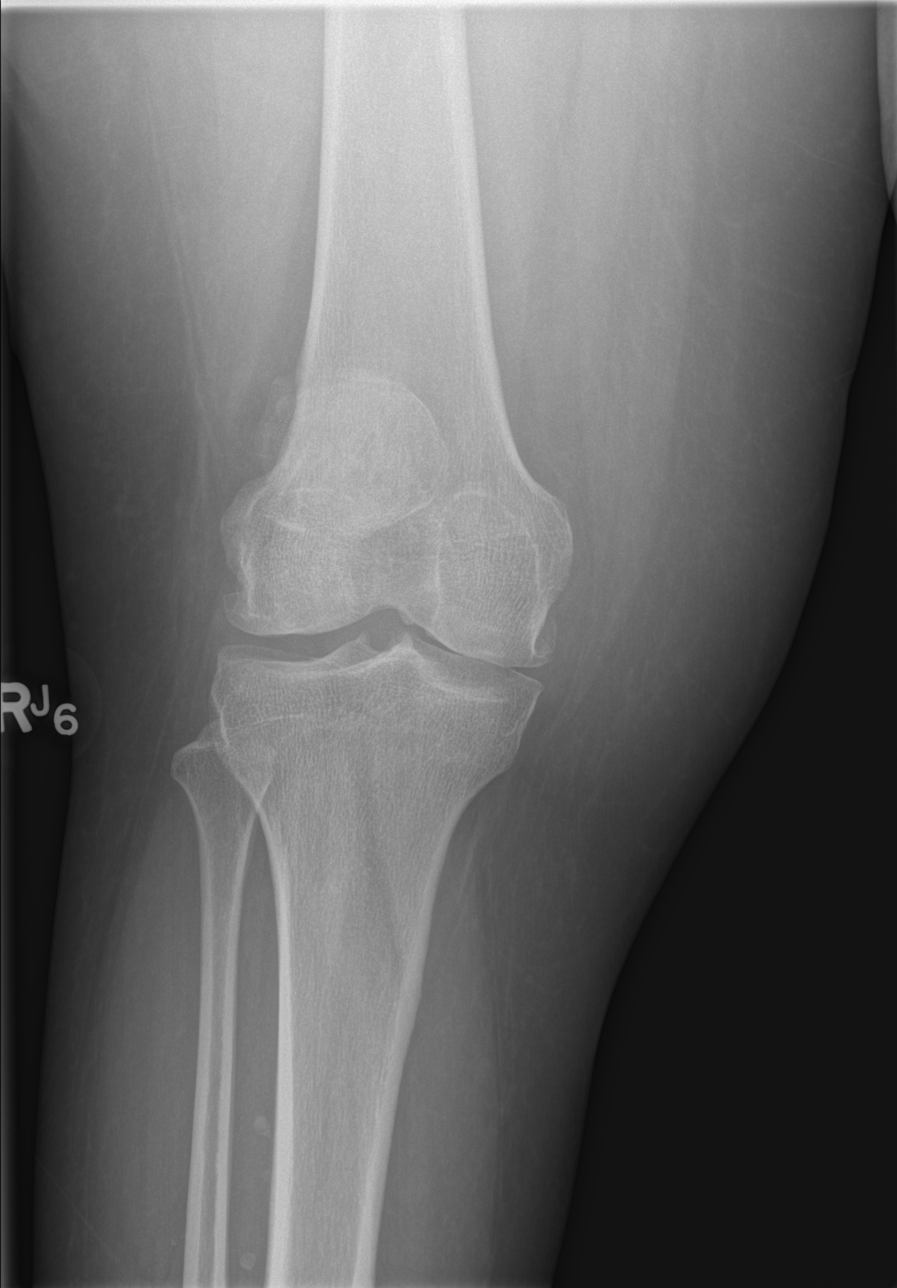

[t knee obl right (1 of 2)]
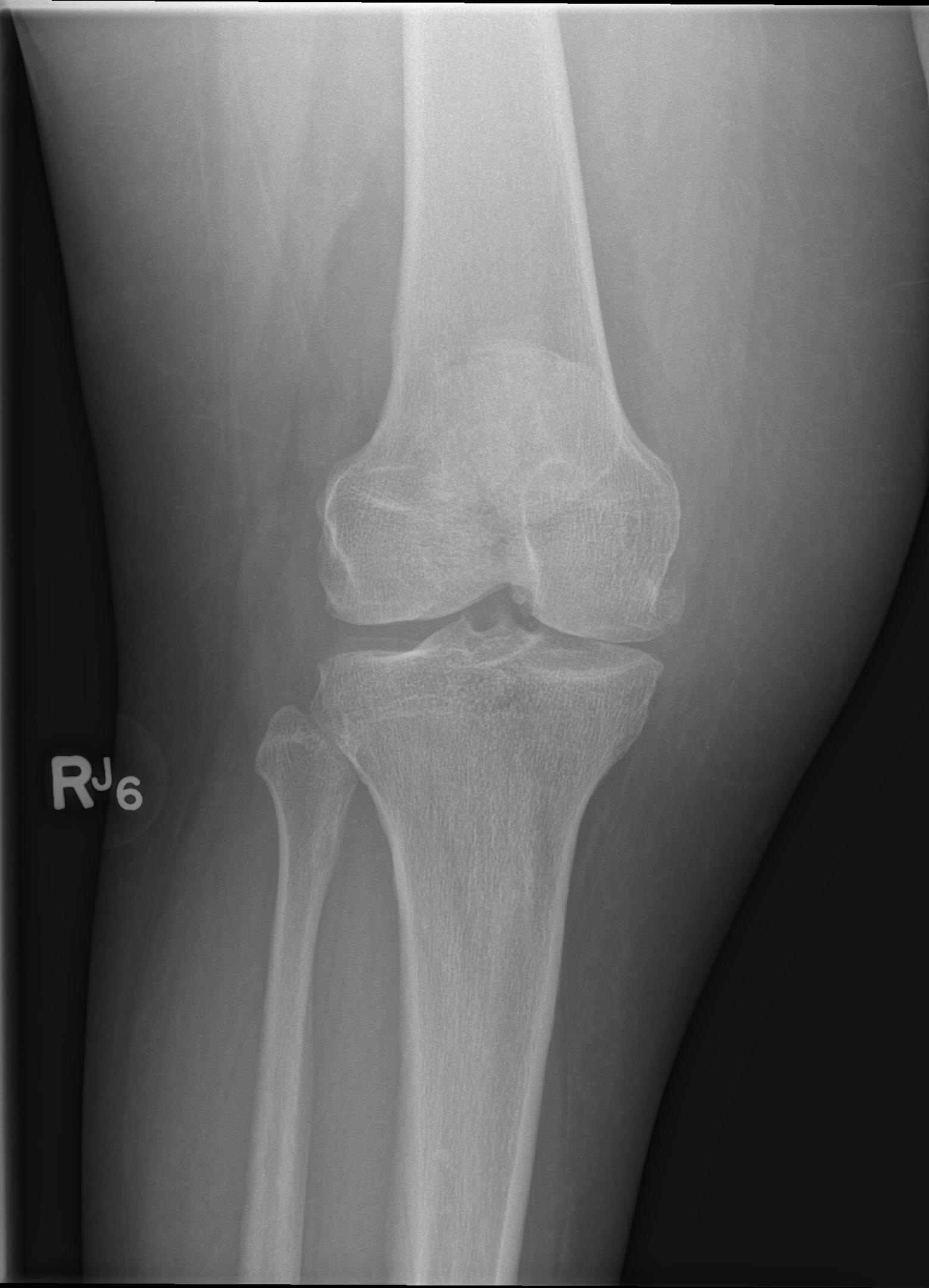

[t knee obl right (2 of 2)]
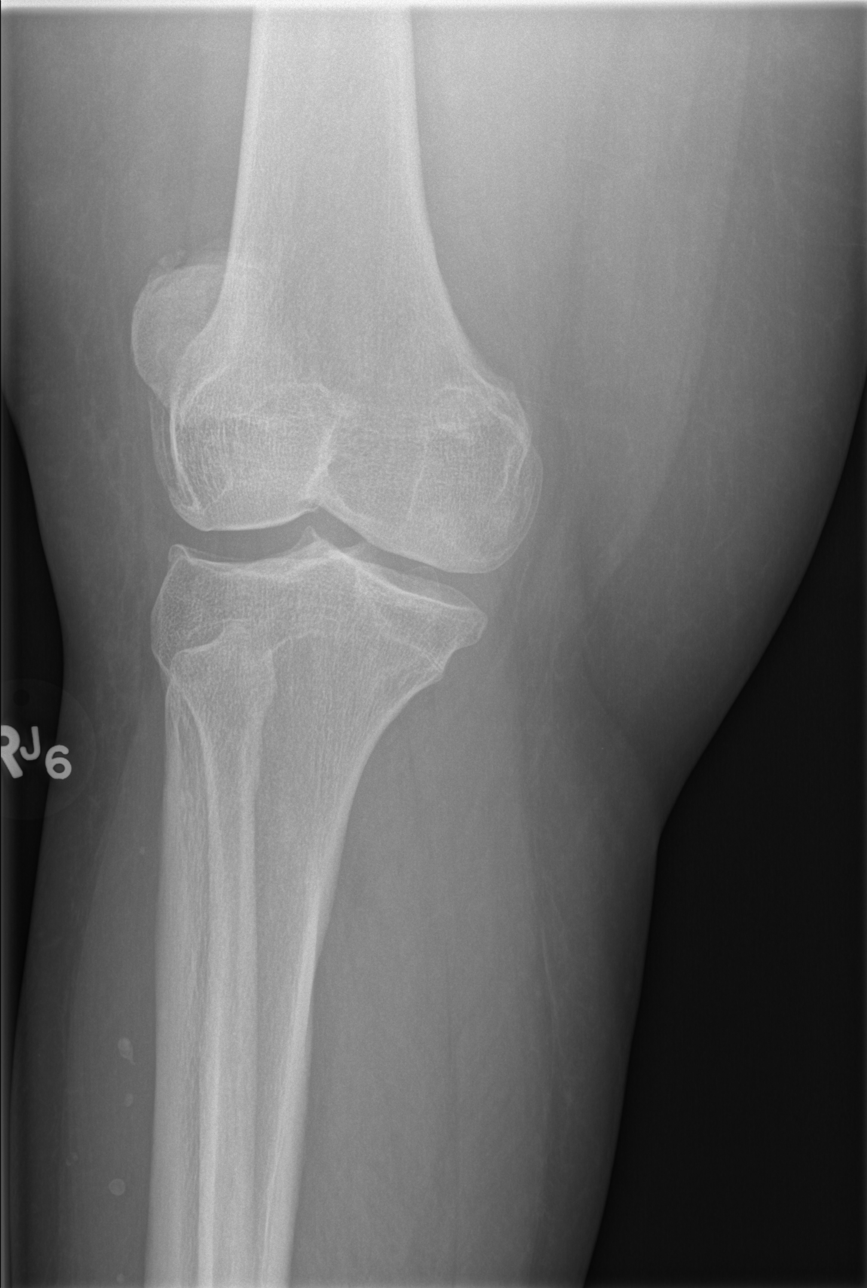

[t knee lat right]
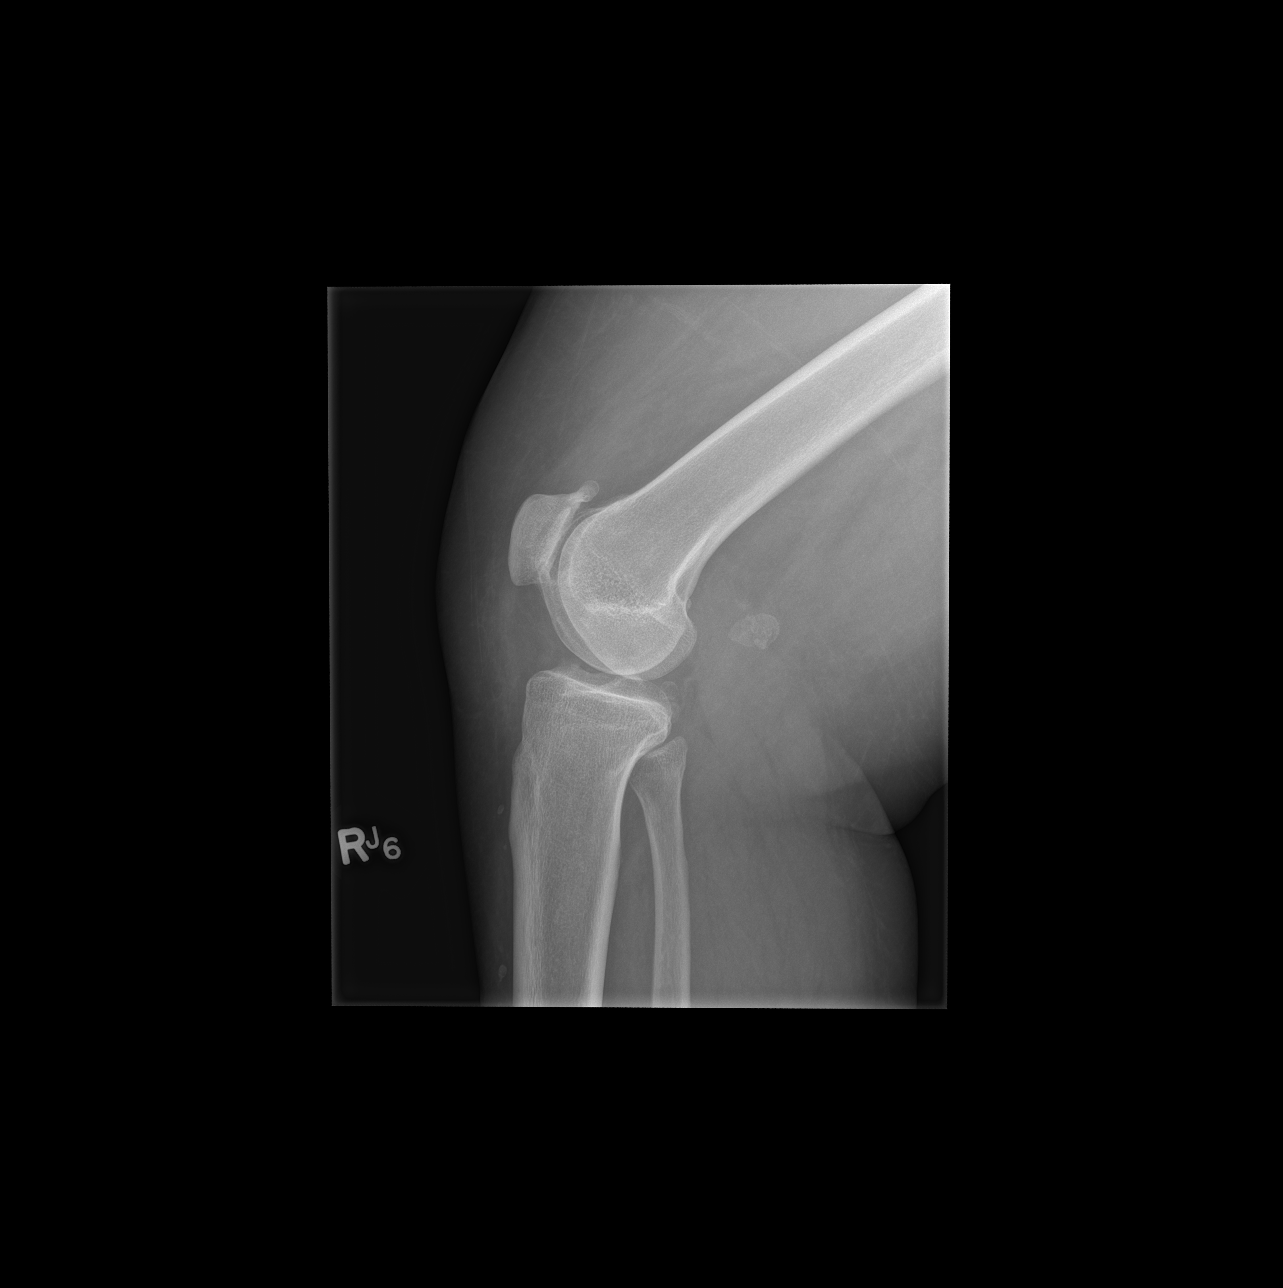

[4 of 4 positions shown; findings below may reference images not displayed]

FINDINGS: No fracture. No bone lesion. There is narrowing of the
patellofemoral joint space compartment. Marginal osteophytes are
noted from all 3 compartments. There is ossification posterior to
the knee most likely in the soft tissues. This could be in a Baker
cyst.

No joint effusion.  Soft tissues are otherwise unremarkable.
IMPRESSION: No fracture or acute finding.

Degenerative changes most evident along the patellofemoral joint
space compartment.

## 2014-11-29 ENCOUNTER — Encounter: Payer: Self-pay | Admitting: *Deleted

## 2014-11-30 ENCOUNTER — Encounter: Payer: Self-pay | Admitting: Obstetrics & Gynecology

## 2014-12-06 ENCOUNTER — Encounter: Payer: Self-pay | Admitting: Internal Medicine

## 2014-12-06 ENCOUNTER — Ambulatory Visit: Payer: Medicare FFS | Attending: Internal Medicine | Admitting: Internal Medicine

## 2014-12-06 VITALS — BP 132/91 | HR 65 | Temp 97.6°F | Resp 16 | Ht 65.0 in | Wt 248.0 lb

## 2014-12-06 DIAGNOSIS — E282 Polycystic ovarian syndrome: Secondary | ICD-10-CM | POA: Diagnosis not present

## 2014-12-06 DIAGNOSIS — I10 Essential (primary) hypertension: Secondary | ICD-10-CM | POA: Insufficient documentation

## 2014-12-06 DIAGNOSIS — Z8742 Personal history of other diseases of the female genital tract: Secondary | ICD-10-CM | POA: Diagnosis not present

## 2014-12-06 DIAGNOSIS — F419 Anxiety disorder, unspecified: Secondary | ICD-10-CM | POA: Diagnosis not present

## 2014-12-06 DIAGNOSIS — G47419 Narcolepsy without cataplexy: Secondary | ICD-10-CM | POA: Insufficient documentation

## 2014-12-06 DIAGNOSIS — Z9221 Personal history of antineoplastic chemotherapy: Secondary | ICD-10-CM | POA: Insufficient documentation

## 2014-12-06 DIAGNOSIS — M722 Plantar fascial fibromatosis: Secondary | ICD-10-CM | POA: Insufficient documentation

## 2014-12-06 DIAGNOSIS — K219 Gastro-esophageal reflux disease without esophagitis: Secondary | ICD-10-CM | POA: Insufficient documentation

## 2014-12-06 DIAGNOSIS — F329 Major depressive disorder, single episode, unspecified: Secondary | ICD-10-CM | POA: Diagnosis not present

## 2014-12-06 DIAGNOSIS — R350 Frequency of micturition: Secondary | ICD-10-CM | POA: Insufficient documentation

## 2014-12-06 DIAGNOSIS — Z79899 Other long term (current) drug therapy: Secondary | ICD-10-CM | POA: Insufficient documentation

## 2014-12-06 DIAGNOSIS — Z791 Long term (current) use of non-steroidal anti-inflammatories (NSAID): Secondary | ICD-10-CM | POA: Diagnosis not present

## 2014-12-06 DIAGNOSIS — L659 Nonscarring hair loss, unspecified: Secondary | ICD-10-CM

## 2014-12-06 DIAGNOSIS — Z853 Personal history of malignant neoplasm of breast: Secondary | ICD-10-CM | POA: Insufficient documentation

## 2014-12-06 DIAGNOSIS — Z923 Personal history of irradiation: Secondary | ICD-10-CM | POA: Insufficient documentation

## 2014-12-06 LAB — POCT URINALYSIS DIPSTICK
Blood, UA: NEGATIVE
GLUCOSE UA: NEGATIVE
Ketones, UA: NEGATIVE
Leukocytes, UA: NEGATIVE
Nitrite, UA: NEGATIVE
PH UA: 6
Spec Grav, UA: 1.02
UROBILINOGEN UA: 2

## 2014-12-06 MED ORDER — ARMODAFINIL 250 MG PO TABS: 250.0000 mg | ORAL_TABLET | Freq: Every day | ORAL | Status: DC

## 2014-12-06 MED ORDER — BUPROPION HCL ER (XL) 150 MG PO TB24: 150.0000 mg | ORAL_TABLET | Freq: Every day | ORAL | Status: AC

## 2014-12-06 MED ORDER — IBUPROFEN 800 MG PO TABS: 800.0000 mg | ORAL_TABLET | Freq: Two times a day (BID) | ORAL | Status: DC | PRN

## 2014-12-06 NOTE — Patient Instructions (Signed)
Plantar Fasciitis (Heel Spur Syndrome) with Rehab The plantar fascia is a fibrous, ligament-like, soft-tissue structure that spans the bottom of the foot. Plantar fasciitis is a condition that causes pain in the foot due to inflammation of the tissue. SYMPTOMS   Pain and tenderness on the underneath side of the foot.  Pain that worsens with standing or walking. CAUSES  Plantar fasciitis is caused by irritation and injury to the plantar fascia on the underneath side of the foot. Common mechanisms of injury include:  Direct trauma to bottom of the foot.  Damage to a small nerve that runs under the foot where the main fascia attaches to the heel bone.  Stress placed on the plantar fascia due to bone spurs. RISK INCREASES WITH:   Activities that place stress on the plantar fascia (running, jumping, pivoting, or cutting).  Poor strength and flexibility.  Improperly fitted shoes.  Tight calf muscles.  Flat feet.  Failure to warm-up properly before activity.  Obesity. PREVENTION  Warm up and stretch properly before activity.  Allow for adequate recovery between workouts.  Maintain physical fitness:  Strength, flexibility, and endurance.  Cardiovascular fitness.  Maintain a health body weight.  Avoid stress on the plantar fascia.  Wear properly fitted shoes, including arch supports for individuals who have flat feet. PROGNOSIS  If treated properly, then the symptoms of plantar fasciitis usually resolve without surgery. However, occasionally surgery is necessary. RELATED COMPLICATIONS   Recurrent symptoms that may result in a chronic condition.  Problems of the lower back that are caused by compensating for the injury, such as limping.  Pain or weakness of the foot during push-off following surgery.  Chronic inflammation, scarring, and partial or complete fascia tear, occurring more often from repeated injections. TREATMENT  Treatment initially involves the use of  ice and medication to help reduce pain and inflammation. The use of strengthening and stretching exercises may help reduce pain with activity, especially stretches of the Achilles tendon. These exercises may be performed at home or with a therapist. Your caregiver may recommend that you use heel cups of arch supports to help reduce stress on the plantar fascia. Occasionally, corticosteroid injections are given to reduce inflammation. If symptoms persist for greater than 6 months despite non-surgical (conservative), then surgery may be recommended.  MEDICATION   If pain medication is necessary, then nonsteroidal anti-inflammatory medications, such as aspirin and ibuprofen, or other minor pain relievers, such as acetaminophen, are often recommended.  Do not take pain medication within 7 days before surgery.  Prescription pain relievers may be given if deemed necessary by your caregiver. Use only as directed and only as much as you need.  Corticosteroid injections may be given by your caregiver. These injections should be reserved for the most serious cases, because they may only be given a certain number of times. HEAT AND COLD  Cold treatment (icing) relieves pain and reduces inflammation. Cold treatment should be applied for 10 to 15 minutes every 2 to 3 hours for inflammation and pain and immediately after any activity that aggravates your symptoms. Use ice packs or massage the area with a piece of ice (ice massage).  Heat treatment may be used prior to performing the stretching and strengthening activities prescribed by your caregiver, physical therapist, or athletic trainer. Use a heat pack or soak the injury in warm water. SEEK IMMEDIATE MEDICAL CARE IF:  Treatment seems to offer no benefit, or the condition worsens.  Any medications produce adverse side effects. EXERCISES RANGE   OF MOTION (ROM) AND STRETCHING EXERCISES - Plantar Fasciitis (Heel Spur Syndrome) These exercises may help you  when beginning to rehabilitate your injury. Your symptoms may resolve with or without further involvement from your physician, physical therapist or athletic trainer. While completing these exercises, remember:   Restoring tissue flexibility helps normal motion to return to the joints. This allows healthier, less painful movement and activity.  An effective stretch should be held for at least 30 seconds.  A stretch should never be painful. You should only feel a gentle lengthening or release in the stretched tissue. RANGE OF MOTION - Toe Extension, Flexion  Sit with your right / left leg crossed over your opposite knee.  Grasp your toes and gently pull them back toward the top of your foot. You should feel a stretch on the bottom of your toes and/or foot.  Hold this stretch for __________ seconds.  Now, gently pull your toes toward the bottom of your foot. You should feel a stretch on the top of your toes and or foot.  Hold this stretch for __________ seconds. Repeat __________ times. Complete this stretch __________ times per day.  RANGE OF MOTION - Ankle Dorsiflexion, Active Assisted  Remove shoes and sit on a chair that is preferably not on a carpeted surface.  Place right / left foot under knee. Extend your opposite leg for support.  Keeping your heel down, slide your right / left foot back toward the chair until you feel a stretch at your ankle or calf. If you do not feel a stretch, slide your bottom forward to the edge of the chair, while still keeping your heel down.  Hold this stretch for __________ seconds. Repeat __________ times. Complete this stretch __________ times per day.  STRETCH - Gastroc, Standing  Place hands on wall.  Extend right / left leg, keeping the front knee somewhat bent.  Slightly point your toes inward on your back foot.  Keeping your right / left heel on the floor and your knee straight, shift your weight toward the wall, not allowing your back to  arch.  You should feel a gentle stretch in the right / left calf. Hold this position for __________ seconds. Repeat __________ times. Complete this stretch __________ times per day. STRETCH - Soleus, Standing  Place hands on wall.  Extend right / left leg, keeping the other knee somewhat bent.  Slightly point your toes inward on your back foot.  Keep your right / left heel on the floor, bend your back knee, and slightly shift your weight over the back leg so that you feel a gentle stretch deep in your back calf.  Hold this position for __________ seconds. Repeat __________ times. Complete this stretch __________ times per day. STRETCH - Gastrocsoleus, Standing  Note: This exercise can place a lot of stress on your foot and ankle. Please complete this exercise only if specifically instructed by your caregiver.   Place the ball of your right / left foot on a step, keeping your other foot firmly on the same step.  Hold on to the wall or a rail for balance.  Slowly lift your other foot, allowing your body weight to press your heel down over the edge of the step.  You should feel a stretch in your right / left calf.  Hold this position for __________ seconds.  Repeat this exercise with a slight bend in your right / left knee. Repeat __________ times. Complete this stretch __________ times per day.    STRENGTHENING EXERCISES - Plantar Fasciitis (Heel Spur Syndrome)  These exercises may help you when beginning to rehabilitate your injury. They may resolve your symptoms with or without further involvement from your physician, physical therapist or athletic trainer. While completing these exercises, remember:   Muscles can gain both the endurance and the strength needed for everyday activities through controlled exercises.  Complete these exercises as instructed by your physician, physical therapist or athletic trainer. Progress the resistance and repetitions only as guided. STRENGTH -  Towel Curls  Sit in a chair positioned on a non-carpeted surface.  Place your foot on a towel, keeping your heel on the floor.  Pull the towel toward your heel by only curling your toes. Keep your heel on the floor.  If instructed by your physician, physical therapist or athletic trainer, add ____________________ at the end of the towel. Repeat __________ times. Complete this exercise __________ times per day. STRENGTH - Ankle Inversion  Secure one end of a rubber exercise band/tubing to a fixed object (table, pole). Loop the other end around your foot just before your toes.  Place your fists between your knees. This will focus your strengthening at your ankle.  Slowly, pull your big toe up and in, making sure the band/tubing is positioned to resist the entire motion.  Hold this position for __________ seconds.  Have your muscles resist the band/tubing as it slowly pulls your foot back to the starting position. Repeat __________ times. Complete this exercises __________ times per day.  Document Released: 11/19/2005 Document Revised: 02/11/2012 Document Reviewed: 03/03/2009 [employer] Patient Information 2015 [employer]. This information is not intended to replace advice given to you by your health care provider. Make sure you discuss any questions you have with your health care provider.  

## 2014-12-06 NOTE — Progress Notes (Signed)
Patient ID: Robin Russo, female   DOB: [birth year], 39 y.o.   MRN: [id number]  YIF:[id number]  OMV:[id number]  DOB - 11/09/1976  CC:  Chief Complaint  Patient presents with  . Annual Exam       HPI: Robin Russo is a 39 y.o. female here today to establish medical care. She has a past medical history of HTN, Narcolepsy, osteoarthritis, insomnia, PCOS, and DDD.  She is currently taking Metformin for possible PCOS.  She states that she was never officially diagnosed with PCOS, but one provider put her on Metformin for symptoms of obesity and facial hair growth.  She is requesting official testing for PCOS.  She notes a family history of diabetes mellitus.   She presents today for c/o of frequent urination and right heel pain.  She reports that she has had frequent urination for several months.  She reports that many of her issues began after chemo and radiation for breast cancer in 2003.  She denies dysuria, flank pain, hematuria, nausea, vomiting, fever, or chills.  Her second compliant is bilateral heel pain that has been present for one month.  She states that the pain is aggravated by getting out of bed in the morning and standing after sitting for a period of time.  She has had mild swelling in her ankles.   Patient would like a referral for management of her narcolepsy.  She is out of her Nuvigil and would like a refill until she is able to get a appointment with Neurology.  She is currently being seen by a psychiatrist for management of depression.    Patient has No headache, No chest pain, No abdominal pain - No Nausea, No new weakness tingling or numbness, No Cough - SOB.  No Known Allergies Past Medical History  Diagnosis Date  . Hypertension   . Narcolepsy   . Osteoarthritis   . Anxiety   . Depression   . Acid reflux   . Allergy    Current Outpatient Prescriptions on File Prior to Visit  Medication Sig Dispense Refill  . acetaminophen (TYLENOL) 650 MG CR tablet Take 650  mg by mouth every 8 (eight) hours as needed for pain.    Marland Kitchen ALPRAZolam (XANAX) 0.25 MG tablet Take 0.25 mg by mouth at bedtime as needed for anxiety.    Marland Kitchen amLODipine (NORVASC) 10 MG tablet Take 10 mg by mouth daily.    . Armodafinil (NUVIGIL) 250 MG tablet Take 250 mg by mouth daily.    Marland Kitchen atenolol (TENORMIN) 50 MG tablet Take 50 mg by mouth daily.    Marland Kitchen buPROPion (WELLBUTRIN XL) 150 MG 24 hr tablet Take 150 mg by mouth daily.    . celecoxib (CELEBREX) 200 MG capsule Take 200 mg by mouth daily.    . Cholecalciferol (VITAMIN D PO) Take 1 tablet by mouth daily.    . cycloSPORINE (RESTASIS) 0.05 % ophthalmic emulsion Place 1 drop into both eyes 2 (two) times daily.    Marland Kitchen esomeprazole (NEXIUM) 40 MG capsule Take 40 mg by mouth 2 (two) times daily before a meal.    . HYDROcodone-acetaminophen (NORCO/VICODIN) 5-325 MG per tablet Take 1 tablet by mouth every 6 (six) hours as needed for moderate pain.    Marland Kitchen ibuprofen (ADVIL,MOTRIN) 800 MG tablet Take 800 mg by mouth every 8 (eight) hours as needed.    Marland Kitchen losartan (COZAAR) 100 MG tablet Take 100 mg by mouth daily.    . metFORMIN (GLUCOPHAGE) 500 MG tablet Take by  mouth 2 (two) times daily with a meal.    . Multiple Vitamins-Minerals (MULTIVITAMIN WITH MINERALS) tablet Take 1 tablet by mouth daily.    . Salicylic Acid-Cleanser 6 % (CREAM) KIT Apply topically.    . traMADol (ULTRAM) 50 MG tablet Take 1 tablet (50 mg total) by mouth every 6 (six) hours as needed. 15 tablet 0  . traZODone (DESYREL) 50 MG tablet Take 50 mg by mouth at bedtime as needed for sleep.     No current facility-administered medications on file prior to visit.   Family History  Problem Relation Age of Onset  . Heart disease Mother   . Diabetes Mother   . Hypertension Mother   . Hyperlipidemia Mother   . Cataracts Mother   . Stroke Mother   . Arthritis Mother   . Heart disease Father   . Hypertension Father   . Hyperlipidemia Father   . Diabetes Father   . Stroke Father   .  Arthritis Father   . Cataracts Father    History   Social History  . Marital Status: Single    Spouse Name: [name]    Number of Children: [name]  . Years of Education: [name]   Occupational History  . Not on file.   Social History Main Topics  . Smoking status: Never Smoker   . Smokeless tobacco: Never Used  . Alcohol Use: Yes     Comment: ocassionally  . Drug Use: No  . Sexual Activity:    Partners: Male    Birth Control/ Protection: None   Other Topics Concern  . Not on file   Social History Narrative  . No narrative on file    Review of Systems: See HPI   Objective:   Filed Vitals:   12/06/14 1123  BP: 132/91  Pulse: 65  Temp: 97.6 F (36.4 C)  Resp: 16    Physical Exam: Constitutional: Patient appears well-developed and well-nourished. No distress. HENT: Normocephalic, atraumatic, External right and left ear normal. Oropharynx is clear and moist.  Eyes: Conjunctivae and EOM are normal. PERRLA, no scleral icterus. Neck: Normal ROM. Neck supple. No JVD. No tracheal deviation. No thyromegaly. CVS: RRR, S1/S2 +, no murmurs, no gallops, no carotid bruit.  Pulmonary: Effort and breath sounds normal, no stridor, rhonchi, wheezes, rales.  Abdominal: Soft. BS +, no distension, tenderness, rebound or guarding.  Musculoskeletal: Normal range of motion. No edema and no tenderness. Neuro: Alert. Normal reflexes, muscle tone coordination. No cranial nerve deficit. Skin: Skin is warm and dry. No rash noted. Not diaphoretic. No erythema. No pallor. Psychiatric: Normal mood and affect. Behavior, judgment, thought content normal.  Lab Results  Component Value Date   WBC 7.3 03/03/2014   HGB 11.6* 03/03/2014   HCT 34.9* 03/03/2014   MCV 83.3 03/03/2014   PLT 280 03/03/2014   Lab Results  Component Value Date   CREATININE 0.84 03/03/2014   BUN 15 03/03/2014   NA 141 03/03/2014   K 3.9 03/03/2014   CL 105 03/03/2014   CO2 26 03/03/2014    No results found for:  HGBA1C Lipid Panel  No results found for: CHOL, TRIG, HDL, CHOLHDL, VLDL, LDLCALC     Assessment and plan:   Brittini was seen today for establish care.  Diagnoses and associated orders for this visit:  Frequent urination - POCT urinalysis dipstick--negative.    History of PCOS - Ambulatory referral to Gynecology--for official testing for PCOS and pap smear  Hair loss - Ambulatory referral  to Dermatology and for facial blemishes   Plantar fasciitis, bilateral - ibuprofen (ADVIL,MOTRIN) 800 MG tablet; Take 1 tablet (800 mg total) by mouth 2 (two) times daily as needed. Advised patient to not take ibuprofen and celebrex together. Demonstrated exercises to help with stretching of the tendon - DG Foot Complete Left; Future - DG Foot Complete Right; Future  Narcolepsy - buPROPion (WELLBUTRIN XL) 150 MG 24 hr tablet; Take 1 tablet (150 mg total) by mouth daily. - Armodafinil (NUVIGIL) 250 MG tablet; Take 1 tablet (250 mg total) by mouth daily.--refilled for one month only - Ambulatory referral to Neurology     Return if symptoms worsen or fail to improve.      Chari Manning, NP-C [facility] [phone] 12/06/2014, 11:09 AM

## 2014-12-06 NOTE — Progress Notes (Signed)
Patient here to establish care Patient has history of L breast CA with lumpectomy in 2003 Patient is not a smoker Patient complaining of right heal pain 2/10

## 2014-12-09 ENCOUNTER — Encounter: Payer: Self-pay | Admitting: Obstetrics and Gynecology

## 2015-01-11 ENCOUNTER — Ambulatory Visit (HOSPITAL_BASED_OUTPATIENT_CLINIC_OR_DEPARTMENT_OTHER): Payer: Medicare FFS | Admitting: Internal Medicine

## 2015-01-11 ENCOUNTER — Encounter: Payer: Self-pay | Admitting: Internal Medicine

## 2015-01-11 ENCOUNTER — Other Ambulatory Visit (HOSPITAL_COMMUNITY)
Admission: RE | Admit: 2015-01-11 | Discharge: 2015-01-11 | Disposition: A | Discharge status: Home / Self Care | Payer: Medicare FFS | Source: Ambulatory Visit | Attending: Internal Medicine | Admitting: Internal Medicine

## 2015-01-11 VITALS — BP 151/90 | HR 77 | Temp 98.6°F | Resp 16 | Ht 64.0 in | Wt 253.0 lb

## 2015-01-11 DIAGNOSIS — Z0000 Encounter for general adult medical examination without abnormal findings: Secondary | ICD-10-CM | POA: Insufficient documentation

## 2015-01-11 DIAGNOSIS — R0981 Nasal congestion: Secondary | ICD-10-CM | POA: Diagnosis present

## 2015-01-11 DIAGNOSIS — N898 Other specified noninflammatory disorders of vagina: Secondary | ICD-10-CM | POA: Insufficient documentation

## 2015-01-11 DIAGNOSIS — M722 Plantar fascial fibromatosis: Secondary | ICD-10-CM | POA: Diagnosis present

## 2015-01-11 LAB — POCT URINALYSIS DIPSTICK
Bilirubin, UA: NEGATIVE
GLUCOSE UA: NEGATIVE
Ketones, UA: NEGATIVE
Leukocytes, UA: NEGATIVE
Nitrite, UA: NEGATIVE
Protein, UA: NEGATIVE
RBC UA: NEGATIVE
SPEC GRAV UA: 1.025
UROBILINOGEN UA: 4
pH, UA: 6.5

## 2015-01-11 MED ORDER — MOMETASONE FUROATE 50 MCG/ACT NA SUSP: 2.0000 | Freq: Every day | NASAL | Status: DC

## 2015-01-11 NOTE — Progress Notes (Signed)
Pt is here c/o abdominal pain w/ white discharge and itching in her vagina. Pt states that she is coughing a lot at night. Pt is requesting to have a xray on her left foot.

## 2015-01-11 NOTE — Progress Notes (Signed)
Patient ID: Robin Russo, female   DOB: 16-Jul-1976, 39 y.o.   MRN: [id number]  CC: vaginal discharge   HPI: Robin Russo is a 39 y.o. female here today for a follow up visit.  Patient has past medical history of HTN, narcolepsy, OA, anxiety, depression.  She states that for the past two weeks she has had abdominal pain with vaginal discharge and itching.  It is a thin white discharge with a fishy odor after intercourse. She denies new sexual partners or STD exposure.  Left foot pain that is progressively becoming worse over time.  She reports that she has tried ibuprofen, icing, and stretches all without relief.   Nasal congestion, flonase not helping. Feels hard to breath at night. Coughing due to throat dryness   No Known Allergies Past Medical History  Diagnosis Date  . Hypertension   . Narcolepsy   . Osteoarthritis   . Anxiety   . Depression   . Acid reflux   . Allergy   . Cancer    Current Outpatient Prescriptions on File Prior to Visit  Medication Sig Dispense Refill  . acetaminophen (TYLENOL) 650 MG CR tablet Take 650 mg by mouth every 8 (eight) hours as needed for pain.    Marland Kitchen amLODipine (NORVASC) 10 MG tablet Take 10 mg by mouth daily.    . Armodafinil (NUVIGIL) 250 MG tablet Take 1 tablet (250 mg total) by mouth daily. 30 tablet 0  . atenolol (TENORMIN) 50 MG tablet Take 50 mg by mouth daily.    Marland Kitchen buPROPion (WELLBUTRIN XL) 150 MG 24 hr tablet Take 1 tablet (150 mg total) by mouth daily. 30 tablet 1  . celecoxib (CELEBREX) 200 MG capsule Take 200 mg by mouth daily.    . Cholecalciferol (VITAMIN D PO) Take 1 tablet by mouth daily.    . cycloSPORINE (RESTASIS) 0.05 % ophthalmic emulsion Place 1 drop into both eyes 2 (two) times daily.    Marland Kitchen esomeprazole (NEXIUM) 40 MG capsule Take 40 mg by mouth 2 (two) times daily before a meal.    . ibuprofen (ADVIL,MOTRIN) 800 MG tablet Take 1 tablet (800 mg total) by mouth 2 (two) times daily as needed. 30 tablet 0  . losartan (COZAAR)  100 MG tablet Take 100 mg by mouth daily.    . metFORMIN (GLUCOPHAGE) 500 MG tablet Take by mouth 2 (two) times daily with a meal.    . Multiple Vitamins-Minerals (MULTIVITAMIN WITH MINERALS) tablet Take 1 tablet by mouth daily.    . traMADol (ULTRAM) 50 MG tablet Take 1 tablet (50 mg total) by mouth every 6 (six) hours as needed. 15 tablet 0  . ALPRAZolam (XANAX) 0.25 MG tablet Take 0.25 mg by mouth at bedtime as needed for anxiety.    Marland Kitchen HYDROcodone-acetaminophen (NORCO/VICODIN) 5-325 MG per tablet Take 1 tablet by mouth every 6 (six) hours as needed for moderate pain.    . Salicylic Acid-Cleanser 6 % (CREAM) KIT Apply topically.    . traZODone (DESYREL) 50 MG tablet Take 50 mg by mouth at bedtime as needed for sleep.     No current facility-administered medications on file prior to visit.   Family History  Problem Relation Age of Onset  . Heart disease Mother   . Diabetes Mother   . Hypertension Mother   . Hyperlipidemia Mother   . Cataracts Mother   . Stroke Mother   . Arthritis Mother   . Heart disease Father   . Hypertension Father   .  Hyperlipidemia Father   . Diabetes Father   . Stroke Father   . Arthritis Father   . Cataracts Father    History   Social History  . Marital Status: Single    Spouse Name: [name]    Number of Children: [name]  . Years of Education: [name]   Occupational History  . Not on file.   Social History Main Topics  . Smoking status: Never Smoker   . Smokeless tobacco: Never Used  . Alcohol Use: Yes     Comment: ocassionally  . Drug Use: No  . Sexual Activity:    Partners: Male    Birth Control/ Protection: None   Other Topics Concern  . Not on file   Social History Narrative    Review of Systems: See HPI    Objective:   Filed Vitals:   01/11/15 1436  BP: 151/90  Pulse: 77  Temp: 98.6 F (37 C)  Resp: 16    Physical Exam  Constitutional: She is oriented to person, place, and time.  HENT:  Right Ear: External ear normal.  Left  Ear: External ear normal.  Mouth/Throat: Oropharynx is clear and moist.  Cardiovascular: Normal rate, regular rhythm and normal heart sounds.   Pulmonary/Chest: Effort normal and breath sounds normal.  Abdominal: Soft. Bowel sounds are normal. There is no tenderness.  Genitourinary: Uterus normal. Cervix exhibits no motion tenderness, no discharge and no friability. Right adnexum displays no tenderness. Left adnexum displays no tenderness. Vaginal discharge found.  Lymphadenopathy:    She has no cervical adenopathy.       Right: No inguinal adenopathy present.       Left: No inguinal adenopathy present.  Neurological: She is alert and oriented to person, place, and time.  Skin: Skin is warm and dry.     Lab Results  Component Value Date   WBC 7.3 03/03/2014   HGB 11.6* 03/03/2014   HCT 34.9* 03/03/2014   MCV 83.3 03/03/2014   PLT 280 03/03/2014   Lab Results  Component Value Date   CREATININE 0.84 03/03/2014   BUN 15 03/03/2014   NA 141 03/03/2014   K 3.9 03/03/2014   CL 105 03/03/2014   CO2 26 03/03/2014    No results found for: HGBA1C Lipid Panel  No results found for: CHOL, TRIG, HDL, CHOLHDL, VLDL, LDLCALC     Assessment and plan:   Robin Russo was seen today for follow-up.  Diagnoses and all orders for this visit:  Vaginal discharge Orders: -     POCT urinalysis dipstick -     Cervicovaginal ancillary only -     Cervicovaginal ancillary only  Nasal congestion Orders: -     mometasone (NASONEX) 50 MCG/ACT nasal spray; Place 2 sprays into the nose daily.  Plantar fasciitis, left Orders: -     Ambulatory referral to Podiatry  Return if symptoms worsen or fail to improve.        Chari Manning, NP-C [facility] [phone] 01/11/2015, 3:24 PM

## 2015-01-12 LAB — CERVICOVAGINAL ANCILLARY ONLY
CHLAMYDIA, DNA PROBE: NEGATIVE
Neisseria Gonorrhea: NEGATIVE
WET PREP (BD AFFIRM): NEGATIVE
Wet Prep (BD Affirm): NEGATIVE
Wet Prep (BD Affirm): POSITIVE — AB

## 2015-01-14 ENCOUNTER — Encounter: Payer: Self-pay | Admitting: Neurology

## 2015-01-14 ENCOUNTER — Ambulatory Visit (INDEPENDENT_AMBULATORY_CARE_PROVIDER_SITE_OTHER): Payer: Medicare FFS | Admitting: Neurology

## 2015-01-14 VITALS — BP 152/93 | HR 72 | Resp 16 | Ht 68.25 in | Wt 255.0 lb

## 2015-01-14 DIAGNOSIS — E669 Obesity, unspecified: Secondary | ICD-10-CM | POA: Diagnosis not present

## 2015-01-14 DIAGNOSIS — G47411 Narcolepsy with cataplexy: Secondary | ICD-10-CM | POA: Diagnosis not present

## 2015-01-14 DIAGNOSIS — G4710 Hypersomnia, unspecified: Secondary | ICD-10-CM | POA: Diagnosis not present

## 2015-01-14 HISTORY — DX: Narcolepsy with cataplexy: G47.411

## 2015-01-14 MED ORDER — ARMODAFINIL 250 MG PO TABS: 250.0000 mg | ORAL_TABLET | Freq: Every day | ORAL | Status: DC

## 2015-01-14 NOTE — Patient Instructions (Signed)
Sodium Oxybate oral solution What is this medicine? SODIUM OXYBATE (SOE dee um OX i bate) is used to treat excessive sleepiness and cataplexy in patients with narcolepsy. Cataplexy causes a sudden muscle weakness due to a strong emotional response. This medicine is not available in retail pharmacies. Your doctor will enroll you in a program that will provide the drug to you. This medicine may be used for other purposes; ask your health care provider or pharmacist if you have questions. COMMON BRAND NAME(S): Xyrem What should I tell my health care provider before I take this medicine? They need to know if you have any of these conditions: -depression, psychosis, or other mood disorders -heart disease or high blood pressure -if you frequently drink alcohol containing beverages -if you have a history of drug or alcohol abuse -liver disease -lung disease or difficulty breathing -seizures -succinic semialdehyde dehydrogenase deficiency -thoughts of suicide -an unusual or allergic reaction to sodium oxybate, other medicines, foods, dyes, or preservatives -pregnant or trying to get pregnant -breast-feeding How should I use this medicine? Take this medicine by mouth. Follow the directions on the prescription label. Take this medicine on an empty stomach, at least 30 minutes before or 2 hours after food. Do not take with food. Do not take your medicine more often than directed. A special MedGuide will be given to you by the pharmacist with each prescription and refill. Be sure to read this information carefully each time. Talk to your pediatrician regarding the use of this medicine in children. Special care may be needed. Overdosage: If you think you have taken too much of this medicine contact a poison control center or emergency room at once. NOTE: This medicine is only for you. Do not share this medicine with others. What if I miss a dose? Skip the missed dose. If it is almost time for your next  dose, take only that dose. Allow at least two and one-half hours between each nightly dose. Do not take double or extra doses. What may interact with this medicine? Do not take this medicine with any of the following medications: -alcohol -barbiturates, like phenobarbital -medicines commonly used for anxiety, sedation or insomnia This medicine may also interact with the following medications: -bupropion -divalproex sodium -dronabinol or marijuana -medicines for psychosis or severe mood disturbances -muscle relaxants -other stimulants, although these are commonly used with sodium oxybate -prescription pain medicines, including tramadol -valproate or valproic acid This list may not describe all possible interactions. Give your health care provider a list of all the medicines, herbs, non-prescription drugs, or dietary supplements you use. Also tell them if you smoke, drink alcohol, or use illegal drugs. Some items may interact with your medicine. What should I watch for while using this medicine? The use of this medicine requires careful supervision. Visit your doctor or health care professional for regular checks on your progress. Do not suddenly stop taking this medicine if you have been taking it for a long time. Withdrawal symptoms may occur. Your doctor or health care professional may need to slowly stop your doses. This medicine may affect your concentration or function. Let your doctor or health care professional know if you have increased sleepiness or confusion during the day. This medicine causes sleep very quickly. You should only take this drug at bedtime, while in bed. Do not drive a car, operate heavy machinery or perform any activities that require mental alertness for at least 6 hours after taking this drug. Use extreme care in any such  daily activities until you know how this medicine affects you. Because alcohol may interfere with this medicine and may cause serious side effects,  you must avoid alcohol-containing beverages while on this medicine. Do not take this medicine along with sleep medicines or other drugs with strong sedative effects, serious side effects may occur. This medicine can be dangerous in overdose. If you take more than prescribed or take it by accident, get emergency medical help right away. What side effects may I notice from receiving this medicine? Side effects that you should report to your doctor or health care professional as soon as possible: -allergic reactions like skin rash, itching or hives, swelling of the face, lips, or tongue -breathing problems -confusion -fast, irregular heartbeat -increased blood pressure, particularly if you already have high blood pressure -memory loss -seizures -sleepwalking -tremors or shaking movements -urinary incontinence Side effects that usually do not require medical attention (report to your doctor or health care professional if they continue or are bothersome): -dizziness -drowsiness -headache -increased urination -nausea, vomiting or stomach upset -unusual dreams This list may not describe all possible side effects. Call your doctor for medical advice about side effects. You may report side effects to FDA at 1-800-FDA-1088. Where should I keep my medicine? Keep out of reach of children. This medicine can be abused. Keep your medicine in a safe place to protect it from theft. Do not share this medicine with anyone. Selling or giving away this medicine is dangerous and against the law. Store at room temperature between 15 and 30 degrees C (59 and 86 degrees F). This medicine may cause accidental overdose and death if it is taken by other adults, children, or pets. Flush any unused medicine down the toilet to reduce the chance of harm. Do not use the medicine after the expiration date. NOTE: This sheet is a summary. It may not cover all possible information. If you have questions about this medicine,  talk to your doctor, pharmacist, or health care provider.  2015, Elsevier/Gold Standard. (2013-06-17 15:45:13) Narcolepsy Narcolepsy is a nervous system disorder that causes daytime sleepiness and sudden bouts of irresistible sleep during the day (sleep attacks). Many people with narcolepsy live with extreme daytime sleepiness for many years before being diagnosed and treated. Narcolepsy is a lifelong (chronic) disorder.  You normally go through cycles when you sleep. When your sleep becomes deeper, you have less body movement, and you start dreaming. This type of deep sleep should happen after about 90 minutes of lighter sleep. Deep sleep is called rapid eye movement (REM) sleep. When you have narcolepsy, REM is not well regulated. It can happen as soon as you fall asleep, and components of REM sleep can occur during the day when you are awake. Uncontrolled REM sleep causes symptoms of narcolepsy. CAUSES Narcolepsy may be caused by an abnormality with a chemical messenger (neurotransmitter) in your brain that controls your sleep and wake cycles. Most people with narcolepsy have low levels of the neurotransmitter hypocretin. Hypocretin is important for controlling wakefulness.  A hypocretin imbalance may be caused by abnormal genes that are passed down through families. It may also develop if the body's defense system (immune system) mistakenly attacks the brain cells that produce hypocretin (autoimmune disease). RISK FACTORS You may be at higher risk for narcolepsy if you have a family history of the disease. Other risk factors that may contribute include:  Injuries, tumors, or infections in the areas of the brain that control sleep.  Exposure to toxins.  Stress.  Hormones produced during puberty and menopause.  Poor sleep habits. SIGNS AND SYMPTOMS  Symptoms of narcolepsy can start at any age but usually begin when people are between the ages of 76 and 44 years. There are four major symptoms.  Not everyone with narcolepsy will have all four.   Excessive daytime sleepiness. This is the most common symptom and is usually the first symptom you will notice.  You may feel as if you are in a mental fog.  Daytime sleepiness may severely affect your performance at work or school.  You may fall asleep during a conversation or while eating dinner.  Sudden loss of muscle tone (cataplexy). You do not lose consciousness, but you may suddenly lose muscle control. When this occurs, your speech may become slurred, or your knees may buckle. This symptom is usually triggered by surprise, anger, or laughter.  Sleep paralysis. You may lose the ability to speak or move just as you start to fall asleep or wake up. You will be aware of the paralysis. It usually lasts for just a few seconds or minutes.  Vivid hallucinations. These may occur with sleep paralysis. The hallucinations are like having bizarre or frightening dreams while you are still awake. Other symptoms may include:   Trouble staying asleep at night (insomnia).  Restless sleep.  Feeling a strong urge to get up at night to smoke or eat. DIAGNOSIS  Your health care provider can diagnose narcolepsy based on your symptoms and the results of two diagnostic tests. You may also be asked to keep a sleep diary for several weeks. You may need the tests if you have had daytime sleepiness for at least 3 months. The two tests are:   A polysomnogram to find out how well your REM sleep is regulated at night. This test is an overnight sleep study. It measures your heart rate, breathing, movement, and brain waves.  A multiple sleep latency test (MSLT) to find out how well your REM sleep is regulated during the day. This is a daytime sleep study. You may need to take several naps during the day. This test also measures your heart rate, breathing, movement, and brain waves. TREATMENT  There is no cure for narcolepsy, but treatment can be very effective in  helping manage the condition. Treatment may include:  Lifestyle and sleeping strategies to help cope with the condition.  Medicines. These may include:  Stimulant medicines to fight daytime sleepiness.  Antidepressant medicines to treat cataplexy.  Sodium oxybate. This is a strong sedative that you take at night. It can help daytime sleepiness and cataplexy. HOME CARE INSTRUCTIONS  Take all medicines as directed by your health care provider.  Follow these sleep practices:  Try to get about 8 hours of sleep every night.  Go to sleep and get up close to the same time every day.  Keep your bedroom dark, quiet, and comfortable.  Schedule short naps for when you feel sleepiest during the day. Tell your employer or teachers that you have narcolepsy. You may be able to adjust your schedule to include time for naps.  Try to get at least 20 minutes of exercise every day. This will help you sleep better at night and reduce daytime sleepiness.  Do not drink alcohol or caffeinated beverages within 4-5 hours of bedtime.  Do not eat a heavy meal before bedtime. Eat at about the same times every day.  Do not smoke.  Do not drive if you are sleepy or  have untreated narcolepsy.  Do not swim or go out on the water without a life jacket. SEEK MEDICAL CARE IF: Your medicines are not controlling your narcolepsy symptoms. SEEK IMMEDIATE MEDICAL CARE IF:  You hurt yourself during a sleep attack or an attack of cataplexy.  You have chest pain or trouble breathing. Document Released: 11/09/2002 Document Revised: 04/05/2014 Document Reviewed: 11/11/2013 [facility] Patient Information 2015 [place], [place]. This information is not intended to replace advice given to you by your health care provider. Make sure you discuss any questions you have with your health care provider.

## 2015-01-14 NOTE — Progress Notes (Signed)
SLEEP MEDICINE CLINIC   Provider:  Larey Seat, M D  Referring Provider: Lance Bosch, NP Primary Care Physician:  Chari Manning, NP  Chief Complaint  Patient presents with  . RV Feliciana Rossetti, NP  Transfer of care Narcolepsy    Rm 11, alone  . [facility]    HPI:  Robin Russo is a 39 y.o. female ,  seen here as a referral  from Dr. Feliciana Rossetti for a sleep consultation,   Robin Russo Is here today as a new patient consultation for an narcolepsy chief complaint. She was followed at [facility] by Dr. Alvira Monday for her sleep condition, but her doctor recently moved and she is trying to establish herself with a new sleep medicine specialist.  The patient has a lifelong history of excessive daytime sleepiness and today's Epworth score at 18 points which is very high. She also has a high fatigue degree of fatigue severity scale was endorsed at 54 points today 01-14-15. Robin Russo was diagnosed with breast cancer in the year 2003 and has been followed for this regularly by her oncologists.  She also suffers from osteoarthritis in her knees and lower back and takes pain medication for this.  She has been treated with armodafinil  ( Nuvigil)  at250 mg for daytime sleepiness ,  was also placed on Wellbutrin as the idea to give her a more alerting wakefulness promoting medication for depression.  Her narcolepsy manifested as sleep attacks while she was working. She had been is sleepier individual for much of her life even in school 8. 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 She fell asleep standing and typing. She has also associated cataplexy spells. Her knees will buckle and her heart will palpitate. She feels this kind of muscle tone loss associated with emotional triggers. 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 However,  first manifestations of cataplexy  were noted in her early 60s.   The patient reports that she first underwent a regular polysomnography study at [facility], which determined that she had no apnea and no other physiological reason to wake up from sleep. Her sleep was described as not sound and she had 37 interruptions or arousals from sleep. She then returned for a sleep study to be followed by an MS LT,  a multiple sleep latency test. This revealed a very short sleep latency and REM sleep onsets. I do not have her results here to  quote from.  It was after the M SLT that the patient started on Nuvigil. Obviously Nuvigil alone is not keeping her sleepiness under control, does not treat her fatigue and she still experiences cataplectic attacks. We will have to discuss today a change in medication a possible use of Xyrem also Xyrem cannot be given alongside and narcotic medication such as hydrocortisone. Xyrem patient's also have to be screened for depression every 3 months and need  liver function tests.The patient is not able to drive as sleepy as she is now..        Review of Systems: Out of a complete 14 system review, the patient complains of only the following symptoms, and all other reviewed systems are negative. Excessive daytime sleepiness endorsing an Epworth sleepiness score of 18 points and a high fatigue severity score of 54 points. This is while being treated  on Nuvigil 250 minute times daily and on Wellbutrin 150 mg. Cataplexy endorsed. Swiss narcolepsy scale -15.  ,  PHQ 9 depression score 6   History   Social History  . Marital Status: Single    Spouse Name: [name]  . Number of Children: [name]  . Years of Education: hs grad   Occupational History  . Disabled    Social History Main Topics  . Smoking status: Never Smoker   . Smokeless tobacco: Never Used  . Alcohol Use: Yes     Comment: ocassionally  . Drug Use: No  . Sexual Activity:    Partners: Male    Birth Control/ Protection: None   Other  Topics Concern  . Not on file   Social History Narrative   Caffeine 3 sodas daily avg.    Family History  Problem Relation Age of Onset  . Heart disease Mother   . Diabetes Mother   . Hypertension Mother   . Hyperlipidemia Mother   . Cataracts Mother   . Stroke Mother   . Arthritis Mother   . Heart disease Father   . Hypertension Father   . Hyperlipidemia Father   . Diabetes Father   . Stroke Father   . Arthritis Father   . Cataracts Father     Past Medical History  Diagnosis Date  . Hypertension   . Narcolepsy   . Osteoarthritis   . Anxiety   . Depression   . Acid reflux   . Allergy   . Cancer     Past Surgical History  Procedure Laterality Date  . Tonsillectomy    . Cholecystectomy    . Breast lumpectomy      Current Outpatient Prescriptions  Medication Sig Dispense Refill  . acetaminophen (TYLENOL) 650 MG CR tablet Take 650 mg by mouth every 8 (eight) hours as needed for pain.    Marland Kitchen ALPRAZolam (XANAX) 0.25 MG tablet Take 0.25 mg by mouth at bedtime as needed for anxiety.    Marland Kitchen amLODipine (NORVASC) 10 MG tablet Take 10 mg by mouth daily.    . Armodafinil (NUVIGIL) 250 MG tablet Take 1 tablet (250 mg total) by mouth daily. 30 tablet 0  . atenolol (TENORMIN) 50 MG tablet Take 50 mg by mouth daily.    . Biotin 1000 MCG tablet Take 1,000 mcg by mouth daily.    Marland Kitchen buPROPion (WELLBUTRIN XL) 150 MG 24 hr tablet Take 1 tablet (150 mg total) by mouth daily. 30 tablet 1  . celecoxib (CELEBREX) 200 MG capsule Take 200 mg by mouth daily.    . Cholecalciferol (VITAMIN D PO) Take 1 tablet by mouth daily.    . clindamycin (CLINDAGEL) 1 % gel Apply 1 application topically at bedtime.    . cycloSPORINE (RESTASIS) 0.05 % ophthalmic emulsion Place 1 drop into both eyes 2 (two) times daily.    . DULoxetine (CYMBALTA) 60 MG capsule Take 60 mg by mouth daily.    Marland Kitchen esomeprazole (NEXIUM) 40 MG capsule Take 40 mg by mouth 2 (two) times daily before a meal.    .  HYDROcodone-acetaminophen (NORCO/VICODIN) 5-325 MG per tablet Take 1 tablet by mouth every 6 (six) hours as needed for moderate pain.    . hydrOXYzine (ATARAX/VISTARIL) 25 MG tablet Take 25 mg by mouth as needed.    Marland Kitchen ibuprofen (ADVIL,MOTRIN) 800 MG tablet Take 1 tablet (800 mg total) by mouth 2 (two) times daily as needed. 30 tablet 0  . losartan (COZAAR) 100 MG tablet  Take 100 mg by mouth daily.    . mometasone (NASONEX) 50 MCG/ACT nasal spray Place 2 sprays into the nose daily. 17 g 12  . Multiple Vitamins-Minerals (MULTIVITAMIN WITH MINERALS) tablet Take 1 tablet by mouth daily.    . traMADol (ULTRAM) 50 MG tablet Take 1 tablet (50 mg total) by mouth every 6 (six) hours as needed. 15 tablet 0  . traZODone (DESYREL) 50 MG tablet Take 50 mg by mouth at bedtime as needed for sleep.    . metFORMIN (GLUCOPHAGE) 500 MG tablet Take by mouth 2 (two) times daily with a meal.     No current facility-administered medications for this visit.    Allergies as of 01/14/2015  . (No Known Allergies)    Vitals: BP 152/93 mmHg  Pulse 72  Resp 16  Ht 5' 8.25" (1.734 m)  Wt 255 lb (115.667 kg)  BMI 38.47 kg/m2  LMP 01/04/2015 Last Weight:  Wt Readings from Last 1 Encounters:  01/14/15 255 lb (115.667 kg)       Last Height:   Ht Readings from Last 1 Encounters:  01/14/15 5' 8.25" (1.734 m)    Physical exam:  General: The patient is awake, alert and appears not in acute distress. The patient is well groomed. Head: Normocephalic, atraumatic. Neck is supple. Mallampati   2 - wide open , neck circumference:16.25 . Nasal airflow unrestricted, TMJ is  Not  evident . Retrognathia is seen.  Cardiovascular:  Regular rate and rhythm , without  murmurs or carotid bruit, and without distended neck veins. Respiratory: Lungs are clear to auscultation. Skin:  Without evidence of edema, or rash Trunk: BMI is elevated and patient  has normal posture.  Neurologic exam : The patient is awake and alert,  oriented to place and time.   Memory subjective  described as intact. There is a normal attention span & concentration ability. Speech is fluent without  dysarthria, dysphonia or aphasia. Mood and affect are appropriate.  Cranial nerves: Pupils are equal and briskly reactive to light. Funduscopic exam without  evidence of pallor or edema. Extraocular movements  in vertical and horizontal planes intact and without nystagmus. Visual fields by finger perimetry are intact. Hearing to finger rub intact.  Facial sensation intact to fine touch. Facial motor strength is symmetric and tongue and uvula move midline.  Motor exam:   Normal tone ,muscle bulk and symmetric ,strength in all extremities.  Sensory:  Fine touch, pinprick and vibration were tested in all extremities. Proprioception is  normal.  Coordination: Rapid alternating movements in the fingers/hands is normal. Finger-to-nose maneuver  normal without evidence of ataxia, dysmetria or tremor.  Gait and station: Patient walks without assistive device and is able unassisted to climb up to the exam table. Strength within normal limits. Stance is stable and normal. Tandem gait is unfragmented. Romberg testing is  negative.  Deep tendon reflexes: in the  upper and lower extremities are symmetric and intact. Babinski  downgoing.   Assessment:  After physical and neurologic examination, review of laboratory studies, imaging, neurophysiology testing and pre-existing records, assessment is   1) narcolepsy with cataplexy - diagnosed in 2010?  It was after the M SLT that the patient started on Nuvigil. Obviously Nuvigil alone is not keeping her sleepiness under control, does not treat her fatigue and she still experiences cataplectic attacks. Insomnia at night, vivid dreams in " technicolor" , she has been kicking and hitting, has been napping with dreams.   We will have to  discuss today a change in medication a possible use of Xyrem also Xyrem cannot  be given alongside and narcotic medication such as hydrocortisone. Xyrem patient's also have to be screened for depression every 3 months and need  liver function tests.The patient is not able to drive as sleepy as she is now. 2) depression  3) breast cancer  4) pain- the patient assured me that she is not longer taken any narcotic pain medications. I will take the narco off her medication list. She can not receive any narcotic prescriptions while on XYREM>     The patient was advised of the nature of the diagnosed sleep disorder , the treatment options and risks for general a health and wellness arising from not treating the condition. Visit duration was 45 minutes, with 50 % of the face to face time used to explain the condition, treatment options for narcolepsy with cataplexy and especially the risks associated with XYREM as the most potent medication for the condition.  Plan:  Treatment plan and additional workup :  PSG with MSLT after being weaned off Wellbutrin.  HLA test for narcolepsy/  Intiate XYREM after liver function tests.    I will change Wellbutrin to imipramine / Tofranil for cataplexy control now. refilll Nuvigil for now.  I have asked for paper referral of previous MSLT ("1 minutes sleep latency")     Larey Seat MD  01/14/2015

## 2015-01-15 LAB — COMPREHENSIVE METABOLIC PANEL
ALBUMIN: 4.1 g/dL (ref 3.5–5.5)
ALT: 20 IU/L (ref 0–32)
AST: 20 IU/L (ref 0–40)
Albumin/Globulin Ratio: 1.3 (ref 1.1–2.5)
Alkaline Phosphatase: 119 IU/L — ABNORMAL HIGH (ref 39–117)
BUN/Creatinine Ratio: 13 (ref 8–20)
BUN: 11 mg/dL (ref 6–20)
Bilirubin Total: 0.5 mg/dL (ref 0.0–1.2)
CO2: 28 mmol/L (ref 18–29)
Calcium: 9.6 mg/dL (ref 8.7–10.2)
Chloride: 99 mmol/L (ref 97–108)
Creatinine, Ser: 0.87 mg/dL (ref 0.57–1.00)
GFR calc Af Amer: 97 mL/min/{1.73_m2} (ref >59)
GFR calc non Af Amer: 84 mL/min/{1.73_m2} (ref >59)
GLOBULIN, TOTAL: 3.1 g/dL (ref 1.5–4.5)
Glucose: 100 mg/dL — ABNORMAL HIGH (ref 65–99)
POTASSIUM: 4.3 mmol/L (ref 3.5–5.2)
SODIUM: 138 mmol/L (ref 134–144)
TOTAL PROTEIN: 7.2 g/dL (ref 6.0–8.5)

## 2015-01-18 ENCOUNTER — Telehealth: Payer: Self-pay | Admitting: *Deleted

## 2015-01-18 MED ORDER — FLUCONAZOLE 150 MG PO TABS: 150.0000 mg | ORAL_TABLET | Freq: Every day | ORAL | Status: DC

## 2015-01-18 NOTE — Telephone Encounter (Signed)
Rx send to [facility] aware of resuts

## 2015-01-18 NOTE — Telephone Encounter (Signed)
-----   Message from Lance Bosch, NP sent at 01/14/2015 11:01 PM EST ----- Positive for yeast. Please send diflucan 150 mg to take PO once. Send 1 tablet with 1 refill. May repeat in one week if no improvement

## 2015-01-19 ENCOUNTER — Encounter: Payer: Self-pay | Admitting: Neurology

## 2015-01-20 ENCOUNTER — Emergency Department (HOSPITAL_COMMUNITY)
Admission: EM | Admit: 2015-01-20 | Discharge: 2015-01-20 | Disposition: A | Discharge status: Home / Self Care | Payer: Medicare FFS | Attending: Emergency Medicine | Admitting: Emergency Medicine

## 2015-01-20 ENCOUNTER — Emergency Department (HOSPITAL_COMMUNITY): Payer: Medicare FFS

## 2015-01-20 ENCOUNTER — Encounter (HOSPITAL_COMMUNITY): Payer: Self-pay | Admitting: Emergency Medicine

## 2015-01-20 DIAGNOSIS — F419 Anxiety disorder, unspecified: Secondary | ICD-10-CM | POA: Diagnosis not present

## 2015-01-20 DIAGNOSIS — F329 Major depressive disorder, single episode, unspecified: Secondary | ICD-10-CM | POA: Insufficient documentation

## 2015-01-20 DIAGNOSIS — Z79899 Other long term (current) drug therapy: Secondary | ICD-10-CM | POA: Diagnosis not present

## 2015-01-20 DIAGNOSIS — Z8669 Personal history of other diseases of the nervous system and sense organs: Secondary | ICD-10-CM | POA: Diagnosis not present

## 2015-01-20 DIAGNOSIS — K219 Gastro-esophageal reflux disease without esophagitis: Secondary | ICD-10-CM | POA: Insufficient documentation

## 2015-01-20 DIAGNOSIS — M1990 Unspecified osteoarthritis, unspecified site: Secondary | ICD-10-CM | POA: Insufficient documentation

## 2015-01-20 DIAGNOSIS — M722 Plantar fascial fibromatosis: Secondary | ICD-10-CM | POA: Diagnosis not present

## 2015-01-20 DIAGNOSIS — M79671 Pain in right foot: Secondary | ICD-10-CM | POA: Diagnosis present

## 2015-01-20 DIAGNOSIS — I10 Essential (primary) hypertension: Secondary | ICD-10-CM | POA: Insufficient documentation

## 2015-01-20 DIAGNOSIS — Z859 Personal history of malignant neoplasm, unspecified: Secondary | ICD-10-CM | POA: Diagnosis not present

## 2015-01-20 DIAGNOSIS — Z791 Long term (current) use of non-steroidal anti-inflammatories (NSAID): Secondary | ICD-10-CM | POA: Insufficient documentation

## 2015-01-20 MED ORDER — IBUPROFEN 600 MG PO TABS: 600.0000 mg | ORAL_TABLET | Freq: Three times a day (TID) | ORAL | Status: DC | PRN

## 2015-01-20 MED ORDER — HYDROCODONE-ACETAMINOPHEN 5-325 MG PO TABS
2.0000 | ORAL_TABLET | Freq: Once | ORAL | Status: AC
  Administered 2015-01-20: 2 via ORAL
  Filled 2015-01-20: qty 2

## 2015-01-20 NOTE — ED Provider Notes (Signed)
CSN: [id number]     Arrival date & time 01/20/15  7829 History  This chart was scribed for non-physician practitioner Brent General, PA-C, working with Jasper Riling. Alvino Chapel, MD by Zola Button, ED Scribe. This patient was seen in room TR05C/TR05C and the patient's care was started at 10:07 AM.      Chief Complaint  Patient presents with  . Foot Pain   The history is provided by the patient. No language interpreter was used.   HPI Comments: Robin Russo is a 39 y.o. female with a hx of HTN and osteoarthritis who presents to the Emergency Department complaining of gradual onset, throbbing right heel pain radiating to her ankle that started a few days ago but worsened yesterday. The pain is worsened with ambulation and standing up. Patient was referred to orthopedics by [facility], but her appointment is not until next month. She states the ibuprofen 800 mg BID and ice treatment that was recommended have not been providing relief to her pain. Patient denies any known injury. She recalls that [facility] orthopedics told her that there may be some irregular bone growth in her foot.  Past Medical History  Diagnosis Date  . Hypertension   . Narcolepsy   . Osteoarthritis   . Anxiety   . Depression   . Acid reflux   . Allergy   . Cancer   . 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 Tonsillectomy    . Cholecystectomy    . Breast lumpectomy     Family History  Problem Relation Age of Onset  . Heart disease Mother   . Diabetes Mother   . Hypertension Mother   . Hyperlipidemia Mother   . Cataracts Mother   . Stroke Mother   . Arthritis Mother   . Heart disease Father   . Hypertension Father   . Hyperlipidemia Father   . Diabetes Father   . Stroke Father   . Arthritis Father   . Cataracts Father    History  Substance Use Topics  . Smoking status: Never Smoker   . Smokeless tobacco: Never Used  . Alcohol Use: Yes      Comment: ocassionally   OB History    Gravida Para Term Preterm AB TAB SAB Ectopic Multiple Living   0 0 0 0 0 0 0 0 0 0      Review of Systems  Constitutional: Negative for fever and chills.  Musculoskeletal: Positive for arthralgias.      Allergies  Review of patient's allergies indicates no known allergies.  Home Medications   Prior to Admission medications   Medication Sig Start Date End Date Taking? Authorizing Provider  acetaminophen (TYLENOL) 650 MG CR tablet Take 650 mg by mouth every 8 (eight) hours as needed for pain.    Historical Provider, MD  ALPRAZolam Duanne Moron) 0.25 MG tablet Take 0.25 mg by mouth at bedtime as needed for anxiety.    Historical Provider, MD  amLODipine (NORVASC) 10 MG tablet Take 10 mg by mouth daily.    Historical Provider, MD  Armodafinil (NUVIGIL) 250 MG tablet Take 1 tablet (250 mg total) by mouth daily. 12/06/14   Lance Bosch, NP  Armodafinil 250 MG tablet Take 1 tablet (250 mg total) by mouth daily. 01/14/15   Asencion Partridge Dohmeier, MD  atenolol (TENORMIN) 50 MG tablet Take 50 mg by mouth daily.    Historical Provider, MD  buPROPion (WELLBUTRIN XL) 150 MG  24 hr tablet Take 1 tablet (150 mg total) by mouth daily. 12/06/14   Lance Bosch, NP  celecoxib (CELEBREX) 200 MG capsule Take 200 mg by mouth daily.    Historical Provider, MD  Cholecalciferol (VITAMIN D PO) Take 1 tablet by mouth daily.    Historical Provider, MD  clindamycin (CLINDAGEL) 1 % gel Apply 1 application topically at bedtime.    Historical Provider, MD  cycloSPORINE (RESTASIS) 0.05 % ophthalmic emulsion Place 1 drop into both eyes 2 (two) times daily.    Historical Provider, MD  DULoxetine (CYMBALTA) 60 MG capsule Take 60 mg by mouth daily.    Historical Provider, MD  esomeprazole (NEXIUM) 40 MG capsule Take 40 mg by mouth 2 (two) times daily before a meal.    Historical Provider, MD  fluconazole (DIFLUCAN) 150 MG tablet Take 1 tablet (150 mg total) by mouth daily. 01/18/15   Lance Bosch, NP  hydrOXYzine (ATARAX/VISTARIL) 25 MG tablet Take 25 mg by mouth as needed.    Historical Provider, MD  ibuprofen (ADVIL,MOTRIN) 600 MG tablet Take 1 tablet (600 mg total) by mouth every 8 (eight) hours as needed. 01/20/15   Carrie Mew, PA-C  ibuprofen (ADVIL,MOTRIN) 800 MG tablet Take 1 tablet (800 mg total) by mouth 2 (two) times daily as needed. 12/06/14   Lance Bosch, NP  losartan (COZAAR) 100 MG tablet Take 100 mg by mouth daily.    Historical Provider, MD  metFORMIN (GLUCOPHAGE) 500 MG tablet Take by mouth 2 (two) times daily with a meal.    Historical Provider, MD  mometasone (NASONEX) 50 MCG/ACT nasal spray Place 2 sprays into the nose daily. 01/11/15   Lance Bosch, NP  Multiple Vitamins-Minerals (MULTIVITAMIN WITH MINERALS) tablet Take 1 tablet by mouth daily.    Historical Provider, MD  traMADol (ULTRAM) 50 MG tablet Take 1 tablet (50 mg total) by mouth every 6 (six) hours as needed. 03/03/14   Elwyn Lade, PA-C  traZODone (DESYREL) 50 MG tablet Take 50 mg by mouth at bedtime as needed for sleep.    Historical Provider, MD   BP 146/90 mmHg  Pulse 72  Temp(Src) 97.9 F (36.6 C) (Oral)  Resp 16  SpO2 100%  LMP 01/04/2015 Physical Exam  Constitutional: She is oriented to person, place, and time. She appears well-developed and well-nourished. No distress.  HENT:  Head: Normocephalic and atraumatic.  Mouth/Throat: Oropharynx is clear and moist. No oropharyngeal exudate.  Eyes: Pupils are equal, round, and reactive to light.  Neck: Neck supple.  Cardiovascular: Normal rate.   Pulses:      Dorsalis pedis pulses are 2+ on the right side.  Capillary refill < 2 seconds distally.  Pulmonary/Chest: Effort normal.  Musculoskeletal: She exhibits no edema.  Right foot: TTP diffusely through her inferior calcaneal region.  Neurological: She is alert and oriented to person, place, and time. No cranial nerve deficit.  Distal sensation intact. 5/5 motor strength in knee and  ankle.  Skin: Skin is warm and dry. No rash noted.  Right foot: No swelling, erythema, deformity or warmth noted.  Psychiatric: She has a normal mood and affect. Her behavior is normal.  Nursing note and vitals reviewed.   ED Course  Procedures  DIAGNOSTIC STUDIES: Oxygen Saturation is 100% on room air, normal by my interpretation.    COORDINATION OF CARE: 10:15 AM-Discussed treatment plan which includes post-op shoe, pain medication and XR imaging with pt at bedside and pt agreed to plan.  Labs Review Labs Reviewed - No data to display  Imaging Review Dg Foot Complete Right  01/20/2015   CLINICAL DATA:  Right heel injury up to the ankle. Question plantar fasciitis  EXAM: RIGHT FOOT COMPLETE - 3+ VIEW  COMPARISON:  None.  FINDINGS: There is no evidence of fracture or dislocation.  A small plantar calcaneal spur is noted, non eroded.  Mild hallux valgus and early bony bunion.  IMPRESSION: 1. No acute osseous findings. 2. Calcaneal spur.   Electronically Signed   By: Monte Fantasia M.D.   On: 01/20/2015 11:20     EKG Interpretation None      MDM   Final diagnoses:  Plantar fasciitis    Patient here with signs and symptoms consistent with plantar fasciitis. No concern for injury or infection. Radiographs of foot remarkable for a calcaneal heel spur which could exacerbate patient's symptoms and cause the plantar fasciitis. Conservative measures discussed, RICE therapy discussed, return precautions discussed, patient strongly encouraged to follow-up with orthopedics at her scheduled appointment next month. Patient verbalizes understanding and agreement of this plan. I encouraged patient to call or return to the ER should she have any questions or concerns.  I personally performed the services described in this documentation, which was scribed in my presence. The recorded information has been reviewed and is accurate.  BP 146/90 mmHg  Pulse 72  Temp(Src) 97.9 F (36.6 C) (Oral)   Resp 16  SpO2 100%  LMP 01/04/2015  Signed,  Dahlia Bailiff, PA-C 2:25 PM   Carrie Mew, PA-C 01/20/15 [place]. Alvino Chapel, MD 01/20/15 1606

## 2015-01-20 NOTE — ED Notes (Signed)
Called xray regarding pt--"transporter has been dispatched".

## 2015-01-20 NOTE — Discharge Instructions (Signed)
Plantar Fasciitis Plantar fasciitis is a common condition that causes foot pain. It is soreness (inflammation) of the band of tough fibrous tissue on the bottom of the foot that runs from the heel bone (calcaneus) to the ball of the foot. The cause of this soreness may be from excessive standing, poor fitting shoes, running on hard surfaces, being overweight, having an abnormal walk, or overuse (this is common in runners) of the painful foot or feet. It is also common in aerobic exercise dancers and ballet dancers. SYMPTOMS  Most people with plantar fasciitis complain of:  Severe pain in the morning on the bottom of their foot especially when taking the first steps out of bed. This pain recedes after a few minutes of walking.  Severe pain is experienced also during walking following a long period of inactivity.  Pain is worse when walking barefoot or up stairs DIAGNOSIS   Your caregiver will diagnose this condition by examining and feeling your foot.  Special tests such as X-rays of your foot, are usually not needed. PREVENTION   Consult a sports medicine professional before beginning a new exercise program.  Walking programs offer a good workout. With walking there is a lower chance of overuse injuries common to runners. There is less impact and less jarring of the joints.  Begin all new exercise programs slowly. If problems or pain develop, decrease the amount of time or distance until you are at a comfortable level.  Wear good shoes and replace them regularly.  Stretch your foot and the heel cords at the back of the ankle (Achilles tendon) both before and after exercise.  Run or exercise on even surfaces that are not hard. For example, asphalt is better than pavement.  Do not run barefoot on hard surfaces.  If using a treadmill, vary the incline.  Do not continue to workout if you have foot or joint problems. Seek professional help if they do not improve. HOME CARE INSTRUCTIONS     Avoid activities that cause you pain until you recover.  Use ice or cold packs on the problem or painful areas after working out.  Only take over-the-counter or prescription medicines for pain, discomfort, or fever as directed by your caregiver.  Soft shoe inserts or athletic shoes with air or gel sole cushions may be helpful.  If problems continue or become more severe, consult a sports medicine caregiver or your own health care provider. Cortisone is a potent anti-inflammatory medication that may be injected into the painful area. You can discuss this treatment with your caregiver. MAKE SURE YOU:   Understand these instructions.  Will watch your condition.  Will get help right away if you are not doing well or get worse. Document Released: 08/14/2001 Document Revised: 02/11/2012 Document Reviewed: 10/13/2008 [facility] Patient Information 2015 [place], [place]. This information is not intended to replace advice given to you by your health care provider. Make sure you discuss any questions you have with your health care provider.   Plantar Fasciitis (Heel Spur Syndrome) with Rehab The plantar fascia is a fibrous, ligament-like, soft-tissue structure that spans the bottom of the foot. Plantar fasciitis is a condition that causes pain in the foot due to inflammation of the tissue. SYMPTOMS   Pain and tenderness on the underneath side of the foot.  Pain that worsens with standing or walking. CAUSES  Plantar fasciitis is caused by irritation and injury to the plantar fascia on the underneath side of the foot. Common mechanisms of injury include:  Direct trauma to bottom of the foot.  Damage to a small nerve that runs under the foot where the main fascia attaches to the heel bone.  Stress placed on the plantar fascia due to bone spurs. RISK INCREASES WITH:   Activities that place stress on the plantar fascia (running, jumping, pivoting, or cutting).  Poor strength and  flexibility.  Improperly fitted shoes.  Tight calf muscles.  Flat feet.  Failure to warm-up properly before activity.  Obesity. PREVENTION  Warm up and stretch properly before activity.  Allow for adequate recovery between workouts.  Maintain physical fitness:  Strength, flexibility, and endurance.  Cardiovascular fitness.  Maintain a health body weight.  Avoid stress on the plantar fascia.  Wear properly fitted shoes, including arch supports for individuals who have flat feet. PROGNOSIS  If treated properly, then the symptoms of plantar fasciitis usually resolve without surgery. However, occasionally surgery is necessary. RELATED COMPLICATIONS   Recurrent symptoms that may result in a chronic condition.  Problems of the lower back that are caused by compensating for the injury, such as limping.  Pain or weakness of the foot during push-off following surgery.  Chronic inflammation, scarring, and partial or complete fascia tear, occurring more often from repeated injections. TREATMENT  Treatment initially involves the use of ice and medication to help reduce pain and inflammation. The use of strengthening and stretching exercises may help reduce pain with activity, especially stretches of the Achilles tendon. These exercises may be performed at home or with a therapist. Your caregiver may recommend that you use heel cups of arch supports to help reduce stress on the plantar fascia. Occasionally, corticosteroid injections are given to reduce inflammation. If symptoms persist for greater than 6 months despite non-surgical (conservative), then surgery may be recommended.  MEDICATION   If pain medication is necessary, then nonsteroidal anti-inflammatory medications, such as aspirin and ibuprofen, or other minor pain relievers, such as acetaminophen, are often recommended.  Do not take pain medication within 7 days before surgery.  Prescription pain relievers may be given if  deemed necessary by your caregiver. Use only as directed and only as much as you need.  Corticosteroid injections may be given by your caregiver. These injections should be reserved for the most serious cases, because they may only be given a certain number of times. HEAT AND COLD  Cold treatment (icing) relieves pain and reduces inflammation. Cold treatment should be applied for 10 to 15 minutes every 2 to 3 hours for inflammation and pain and immediately after any activity that aggravates your symptoms. Use ice packs or massage the area with a piece of ice (ice massage).  Heat treatment may be used prior to performing the stretching and strengthening activities prescribed by your caregiver, physical therapist, or athletic trainer. Use a heat pack or soak the injury in warm water. SEEK IMMEDIATE MEDICAL CARE IF:  Treatment seems to offer no benefit, or the condition worsens.  Any medications produce adverse side effects. EXERCISES RANGE OF MOTION (ROM) AND STRETCHING EXERCISES - Plantar Fasciitis (Heel Spur Syndrome) These exercises may help you when beginning to rehabilitate your injury. Your symptoms may resolve with or without further involvement from your physician, physical therapist or athletic trainer. While completing these exercises, remember:   Restoring tissue flexibility helps normal motion to return to the joints. This allows healthier, less painful movement and activity.  An effective stretch should be held for at least 30 seconds.  A stretch should never be painful. You should  only feel a gentle lengthening or release in the stretched tissue. RANGE OF MOTION - Toe Extension, Flexion  Sit with your right / left leg crossed over your opposite knee.  Grasp your toes and gently pull them back toward the top of your foot. You should feel a stretch on the bottom of your toes and/or foot.  Hold this stretch for __________ seconds.  Now, gently pull your toes toward the bottom  of your foot. You should feel a stretch on the top of your toes and or foot.  Hold this stretch for __________ seconds. Repeat __________ times. Complete this stretch __________ times per day.  RANGE OF MOTION - Ankle Dorsiflexion, Active Assisted  Remove shoes and sit on a chair that is preferably not on a carpeted surface.  Place right / left foot under knee. Extend your opposite leg for support.  Keeping your heel down, slide your right / left foot back toward the chair until you feel a stretch at your ankle or calf. If you do not feel a stretch, slide your bottom forward to the edge of the chair, while still keeping your heel down.  Hold this stretch for __________ seconds. Repeat __________ times. Complete this stretch __________ times per day.  STRETCH - Gastroc, Standing  Place hands on wall.  Extend right / left leg, keeping the front knee somewhat bent.  Slightly point your toes inward on your back foot.  Keeping your right / left heel on the floor and your knee straight, shift your weight toward the wall, not allowing your back to arch.  You should feel a gentle stretch in the right / left calf. Hold this position for __________ seconds. Repeat __________ times. Complete this stretch __________ times per day. STRETCH - Soleus, Standing  Place hands on wall.  Extend right / left leg, keeping the other knee somewhat bent.  Slightly point your toes inward on your back foot.  Keep your right / left heel on the floor, bend your back knee, and slightly shift your weight over the back leg so that you feel a gentle stretch deep in your back calf.  Hold this position for __________ seconds. Repeat __________ times. Complete this stretch __________ times per day. STRETCH - Gastrocsoleus, Standing  Note: This exercise can place a lot of stress on your foot and ankle. Please complete this exercise only if specifically instructed by your caregiver.   Place the ball of your right  / left foot on a step, keeping your other foot firmly on the same step.  Hold on to the wall or a rail for balance.  Slowly lift your other foot, allowing your body weight to press your heel down over the edge of the step.  You should feel a stretch in your right / left calf.  Hold this position for __________ seconds.  Repeat this exercise with a slight bend in your right / left knee. Repeat __________ times. Complete this stretch __________ times per day.  STRENGTHENING EXERCISES - Plantar Fasciitis (Heel Spur Syndrome)  These exercises may help you when beginning to rehabilitate your injury. They may resolve your symptoms with or without further involvement from your physician, physical therapist or athletic trainer. While completing these exercises, remember:   Muscles can gain both the endurance and the strength needed for everyday activities through controlled exercises.  Complete these exercises as instructed by your physician, physical therapist or athletic trainer. Progress the resistance and repetitions only as guided. STRENGTH - Towel  Curls  Sit in a chair positioned on a non-carpeted surface.  Place your foot on a towel, keeping your heel on the floor.  Pull the towel toward your heel by only curling your toes. Keep your heel on the floor.  If instructed by your physician, physical therapist or athletic trainer, add ____________________ at the end of the towel. Repeat __________ times. Complete this exercise __________ times per day. STRENGTH - Ankle Inversion  Secure one end of a rubber exercise band/tubing to a fixed object (table, pole). Loop the other end around your foot just before your toes.  Place your fists between your knees. This will focus your strengthening at your ankle.  Slowly, pull your big toe up and in, making sure the band/tubing is positioned to resist the entire motion.  Hold this position for __________ seconds.  Have your muscles resist the  band/tubing as it slowly pulls your foot back to the starting position. Repeat __________ times. Complete this exercises __________ times per day.  Document Released: 11/19/2005 Document Revised: 02/11/2012 Document Reviewed: 03/03/2009 [facility] Patient Information 2015 [place], [place]. This information is not intended to replace advice given to you by your health care provider. Make sure you discuss any questions you have with your health care provider.

## 2015-01-20 NOTE — Progress Notes (Signed)
Quick Note:  Called and left message for patient relayed normal labs. ______

## 2015-01-20 NOTE — ED Notes (Signed)
Pt started 2 days ago with right heel pain. No known injury. No change in activity. Pt has been referred to Ortho by [facility]. Has appointment in March.

## 2015-01-27 ENCOUNTER — Encounter: Payer: Medicare Other | Admitting: Obstetrics and Gynecology

## 2015-02-02 ENCOUNTER — Ambulatory Visit: Payer: Medicare FFS

## 2015-02-02 ENCOUNTER — Encounter: Payer: Self-pay | Admitting: Podiatry

## 2015-02-02 ENCOUNTER — Telehealth: Payer: Self-pay | Admitting: Neurology

## 2015-02-02 ENCOUNTER — Ambulatory Visit (INDEPENDENT_AMBULATORY_CARE_PROVIDER_SITE_OTHER): Payer: Medicare FFS | Admitting: Podiatry

## 2015-02-02 VITALS — BP 135/85 | HR 89 | Resp 18

## 2015-02-02 DIAGNOSIS — M722 Plantar fascial fibromatosis: Secondary | ICD-10-CM

## 2015-02-02 DIAGNOSIS — M79671 Pain in right foot: Secondary | ICD-10-CM

## 2015-02-02 MED ORDER — TRIAMCINOLONE ACETONIDE 10 MG/ML IJ SUSP
10.0000 mg | Freq: Once | INTRAMUSCULAR | Status: AC
  Administered 2015-02-02: 10 mg

## 2015-02-02 NOTE — Telephone Encounter (Signed)
All clinical info was already provided to ins.  Request is currently under review.

## 2015-02-02 NOTE — Telephone Encounter (Signed)
Patient is calling to get pre authorization for medication Armodafinil (NUVIGIL) 250 MG tablet. Patient states [facility] on [address] states request was faxed to our office. Thank you.

## 2015-02-02 NOTE — Patient Instructions (Signed)
Plantar Fasciitis (Heel Spur Syndrome) with Rehab The plantar fascia is a fibrous, ligament-like, soft-tissue structure that spans the bottom of the foot. Plantar fasciitis is a condition that causes pain in the foot due to inflammation of the tissue. SYMPTOMS   Pain and tenderness on the underneath side of the foot.  Pain that worsens with standing or walking. CAUSES  Plantar fasciitis is caused by irritation and injury to the plantar fascia on the underneath side of the foot. Common mechanisms of injury include:  Direct trauma to bottom of the foot.  Damage to a small nerve that runs under the foot where the main fascia attaches to the heel bone.  Stress placed on the plantar fascia due to bone spurs. RISK INCREASES WITH:   Activities that place stress on the plantar fascia (running, jumping, pivoting, or cutting).  Poor strength and flexibility.  Improperly fitted shoes.  Tight calf muscles.  Flat feet.  Failure to warm-up properly before activity.  Obesity. PREVENTION  Warm up and stretch properly before activity.  Allow for adequate recovery between workouts.  Maintain physical fitness:  Strength, flexibility, and endurance.  Cardiovascular fitness.  Maintain a health body weight.  Avoid stress on the plantar fascia.  Wear properly fitted shoes, including arch supports for individuals who have flat feet. PROGNOSIS  If treated properly, then the symptoms of plantar fasciitis usually resolve without surgery. However, occasionally surgery is necessary. RELATED COMPLICATIONS   Recurrent symptoms that may result in a chronic condition.  Problems of the lower back that are caused by compensating for the injury, such as limping.  Pain or weakness of the foot during push-off following surgery.  Chronic inflammation, scarring, and partial or complete fascia tear, occurring more often from repeated injections. TREATMENT  Treatment initially involves the use of  ice and medication to help reduce pain and inflammation. The use of strengthening and stretching exercises may help reduce pain with activity, especially stretches of the Achilles tendon. These exercises may be performed at home or with a therapist. Your caregiver may recommend that you use heel cups of arch supports to help reduce stress on the plantar fascia. Occasionally, corticosteroid injections are given to reduce inflammation. If symptoms persist for greater than 6 months despite non-surgical (conservative), then surgery may be recommended.  MEDICATION   If pain medication is necessary, then nonsteroidal anti-inflammatory medications, such as aspirin and ibuprofen, or other minor pain relievers, such as acetaminophen, are often recommended.  Do not take pain medication within 7 days before surgery.  Prescription pain relievers may be given if deemed necessary by your caregiver. Use only as directed and only as much as you need.  Corticosteroid injections may be given by your caregiver. These injections should be reserved for the most serious cases, because they may only be given a certain number of times. HEAT AND COLD  Cold treatment (icing) relieves pain and reduces inflammation. Cold treatment should be applied for 10 to 15 minutes every 2 to 3 hours for inflammation and pain and immediately after any activity that aggravates your symptoms. Use ice packs or massage the area with a piece of ice (ice massage).  Heat treatment may be used prior to performing the stretching and strengthening activities prescribed by your caregiver, physical therapist, or athletic trainer. Use a heat pack or soak the injury in warm water. SEEK IMMEDIATE MEDICAL CARE IF:  Treatment seems to offer no benefit, or the condition worsens.  Any medications produce adverse side effects. EXERCISES RANGE   OF MOTION (ROM) AND STRETCHING EXERCISES - Plantar Fasciitis (Heel Spur Syndrome) These exercises may help you  when beginning to rehabilitate your injury. Your symptoms may resolve with or without further involvement from your physician, physical therapist or athletic trainer. While completing these exercises, remember:   Restoring tissue flexibility helps normal motion to return to the joints. This allows healthier, less painful movement and activity.  An effective stretch should be held for at least 30 seconds.  A stretch should never be painful. You should only feel a gentle lengthening or release in the stretched tissue. RANGE OF MOTION - Toe Extension, Flexion  Sit with your right / left leg crossed over your opposite knee.  Grasp your toes and gently pull them back toward the top of your foot. You should feel a stretch on the bottom of your toes and/or foot.  Hold this stretch for __________ seconds.  Now, gently pull your toes toward the bottom of your foot. You should feel a stretch on the top of your toes and or foot.  Hold this stretch for __________ seconds. Repeat __________ times. Complete this stretch __________ times per day.  RANGE OF MOTION - Ankle Dorsiflexion, Active Assisted  Remove shoes and sit on a chair that is preferably not on a carpeted surface.  Place right / left foot under knee. Extend your opposite leg for support.  Keeping your heel down, slide your right / left foot back toward the chair until you feel a stretch at your ankle or calf. If you do not feel a stretch, slide your bottom forward to the edge of the chair, while still keeping your heel down.  Hold this stretch for __________ seconds. Repeat __________ times. Complete this stretch __________ times per day.  STRETCH - Gastroc, Standing  Place hands on wall.  Extend right / left leg, keeping the front knee somewhat bent.  Slightly point your toes inward on your back foot.  Keeping your right / left heel on the floor and your knee straight, shift your weight toward the wall, not allowing your back to  arch.  You should feel a gentle stretch in the right / left calf. Hold this position for __________ seconds. Repeat __________ times. Complete this stretch __________ times per day. STRETCH - Soleus, Standing  Place hands on wall.  Extend right / left leg, keeping the other knee somewhat bent.  Slightly point your toes inward on your back foot.  Keep your right / left heel on the floor, bend your back knee, and slightly shift your weight over the back leg so that you feel a gentle stretch deep in your back calf.  Hold this position for __________ seconds. Repeat __________ times. Complete this stretch __________ times per day. STRETCH - Gastrocsoleus, Standing  Note: This exercise can place a lot of stress on your foot and ankle. Please complete this exercise only if specifically instructed by your caregiver.   Place the ball of your right / left foot on a step, keeping your other foot firmly on the same step.  Hold on to the wall or a rail for balance.  Slowly lift your other foot, allowing your body weight to press your heel down over the edge of the step.  You should feel a stretch in your right / left calf.  Hold this position for __________ seconds.  Repeat this exercise with a slight bend in your right / left knee. Repeat __________ times. Complete this stretch __________ times per day.    STRENGTHENING EXERCISES - Plantar Fasciitis (Heel Spur Syndrome)  These exercises may help you when beginning to rehabilitate your injury. They may resolve your symptoms with or without further involvement from your physician, physical therapist or athletic trainer. While completing these exercises, remember:   Muscles can gain both the endurance and the strength needed for everyday activities through controlled exercises.  Complete these exercises as instructed by your physician, physical therapist or athletic trainer. Progress the resistance and repetitions only as guided. STRENGTH -  Towel Curls  Sit in a chair positioned on a non-carpeted surface.  Place your foot on a towel, keeping your heel on the floor.  Pull the towel toward your heel by only curling your toes. Keep your heel on the floor.  If instructed by your physician, physical therapist or athletic trainer, add ____________________ at the end of the towel. Repeat __________ times. Complete this exercise __________ times per day. STRENGTH - Ankle Inversion  Secure one end of a rubber exercise band/tubing to a fixed object (table, pole). Loop the other end around your foot just before your toes.  Place your fists between your knees. This will focus your strengthening at your ankle.  Slowly, pull your big toe up and in, making sure the band/tubing is positioned to resist the entire motion.  Hold this position for __________ seconds.  Have your muscles resist the band/tubing as it slowly pulls your foot back to the starting position. Repeat __________ times. Complete this exercises __________ times per day.  Document Released: 11/19/2005 Document Revised: 02/11/2012 Document Reviewed: 03/03/2009 [employer] Patient Information 2015 [employer]. This information is not intended to replace advice given to you by your health care provider. Make sure you discuss any questions you have with your health care provider.  

## 2015-02-02 NOTE — Progress Notes (Signed)
   Subjective:    Patient ID: Robin Russo, female    DOB: May 11, 1976, 39 y.o.   MRN: [id number]  HPI  39 year old female presents the office today with complaints of right heel pain which has been ongoing for proximally 6+ months. She states that she has pain in the mornings or after periods of rest which is relieved by ambulation. She describes the pain as a pulling sensation into her heel on a dull ache. She denies any numbness or tingling to her feet. The pain does not wake her up at night. She states that she previously has seen by [facility] who prescribed an ankle brace as well as inserts which do not seem to help alleviate her symptoms. She denies any swelling or redness overlying the area. She denies any history of injury or trauma or any increase or change in activity time of onset of symptoms. No other complaints at this time.    Review of Systems  All other systems reviewed and are negative.      Objective:   Physical Exam AAO x3, NAD DP/PT pulses palpable bilaterally, CRT less than 3 seconds Protective sensation intact with Simms Weinstein monofilament, vibratory sensation intact, Achilles tendon reflex intact Tenderness to palpation overlying the plantar medial tubercle of the calcaneus to right heel at the insertion of the plantar fascia. There is no pain along the course of plantar fascial within the arch of the foot and the plantar fascia appears intact. There is no pain with lateral compression of the calcaneus or pain the vibratory sensation. No pain on the posterior aspect of the calcaneus or along the course/insertion of the Achilles tendon. There is no overlying edema, erythema, increase in warmth. No other areas of tenderness palpation or pain with vibratory sensation to the foot/ankle. MMT 5/5, ROM WNL No open lesions or pre-ulcerative lesions are identified. No pain with calf compression, swelling, warmth, erythema.      Assessment & Plan:  39 year old  female with right heel pain, likely plantar fasciitis. -Previous x-rays which were obtained were reviewed with the patient. -Treatment options were discussed the patient living alternatives, risks, complications. -Patient elects to proceed with steroid injection into the right heel. Under sterile skin preparation, a total of 2.5cc of kenalog 10, 0.5% Marcaine plain, and 2% lidocaine plain were infiltrated into the symptomatic area without complication. A band-aid was applied. Patient tolerated the injection well without complication. Post-injection care with discussed with the patient. Discussed with the patient to ice the area over the next couple of days to help prevent a steroid flare.  -Plantar fascial taping was applied. -Continue ice daily. -Discussed stretching exercises -Discussed shoe gear modifications. Recommended patient not to go barefoot even at home. Continue orthotics. -Follow-up in 3 weeks or sooner if any problems are to arise.

## 2015-02-03 ENCOUNTER — Telehealth: Payer: Self-pay | Admitting: *Deleted

## 2015-02-03 ENCOUNTER — Telehealth: Payer: Self-pay

## 2015-02-03 NOTE — Telephone Encounter (Signed)
[employer] has approved the request for coverage on Nuvigil effective until 21/62/4469 or until the policy changes or is terminated Ref # [id number] Ref Key: [place]

## 2015-02-03 NOTE — Telephone Encounter (Signed)
I called and gave her the results of normal narcolepsy test.   Gave her the approval notice that [facility] relayed to [place] re: nuvigil.

## 2015-02-09 ENCOUNTER — Ambulatory Visit (INDEPENDENT_AMBULATORY_CARE_PROVIDER_SITE_OTHER): Payer: Medicare HMO | Admitting: Neurology

## 2015-02-09 DIAGNOSIS — G47411 Narcolepsy with cataplexy: Secondary | ICD-10-CM

## 2015-02-09 DIAGNOSIS — G4733 Obstructive sleep apnea (adult) (pediatric): Secondary | ICD-10-CM

## 2015-02-09 DIAGNOSIS — E669 Obesity, unspecified: Secondary | ICD-10-CM

## 2015-02-09 DIAGNOSIS — G4710 Hypersomnia, unspecified: Secondary | ICD-10-CM

## 2015-02-10 NOTE — Sleep Study (Signed)
See attached document in Encounters tab 

## 2015-02-11 ENCOUNTER — Other Ambulatory Visit: Payer: Self-pay

## 2015-02-11 DIAGNOSIS — E669 Obesity, unspecified: Secondary | ICD-10-CM

## 2015-02-11 DIAGNOSIS — G47411 Narcolepsy with cataplexy: Secondary | ICD-10-CM

## 2015-02-11 DIAGNOSIS — G4710 Hypersomnia, unspecified: Secondary | ICD-10-CM

## 2015-02-11 MED ORDER — ARMODAFINIL 250 MG PO TABS: 250.0000 mg | ORAL_TABLET | Freq: Every day | ORAL | Status: DC

## 2015-02-23 ENCOUNTER — Ambulatory Visit (INDEPENDENT_AMBULATORY_CARE_PROVIDER_SITE_OTHER): Payer: Medicare HMO | Admitting: Podiatry

## 2015-02-23 ENCOUNTER — Telehealth: Payer: Self-pay | Admitting: *Deleted

## 2015-02-23 ENCOUNTER — Encounter: Payer: Self-pay | Admitting: Neurology

## 2015-02-23 ENCOUNTER — Encounter: Payer: Self-pay | Admitting: Podiatry

## 2015-02-23 VITALS — BP 148/95 | HR 85 | Resp 18

## 2015-02-23 DIAGNOSIS — M722 Plantar fascial fibromatosis: Secondary | ICD-10-CM

## 2015-02-23 MED ORDER — TRIAMCINOLONE ACETONIDE 10 MG/ML IJ SUSP
10.0000 mg | Freq: Once | INTRAMUSCULAR | Status: AC
  Administered 2015-02-23: 10 mg

## 2015-02-23 MED ORDER — IBUPROFEN 800 MG PO TABS: 800.0000 mg | ORAL_TABLET | Freq: Two times a day (BID) | ORAL | Status: DC | PRN

## 2015-02-23 MED ORDER — IBUPROFEN 800 MG PO TABS
800.0000 mg | ORAL_TABLET | Freq: Two times a day (BID) | ORAL | Status: DC | PRN
Start: 2015-02-23 — End: 2015-02-23

## 2015-02-23 NOTE — Patient Instructions (Addendum)
You can purchase a night splint over the counter to help.   Plantar Fasciitis (Heel Spur Syndrome) with Rehab The plantar fascia is a fibrous, ligament-like, soft-tissue structure that spans the bottom of the foot. Plantar fasciitis is a condition that causes pain in the foot due to inflammation of the tissue. SYMPTOMS   Pain and tenderness on the underneath side of the foot.  Pain that worsens with standing or walking. CAUSES  Plantar fasciitis is caused by irritation and injury to the plantar fascia on the underneath side of the foot. Common mechanisms of injury include:  Direct trauma to bottom of the foot.  Damage to a small nerve that runs under the foot where the main fascia attaches to the heel bone.  Stress placed on the plantar fascia due to bone spurs. RISK INCREASES WITH:   Activities that place stress on the plantar fascia (running, jumping, pivoting, or cutting).  Poor strength and flexibility.  Improperly fitted shoes.  Tight calf muscles.  Flat feet.  Failure to warm-up properly before activity.  Obesity. PREVENTION  Warm up and stretch properly before activity.  Allow for adequate recovery between workouts.  Maintain physical fitness:  Strength, flexibility, and endurance.  Cardiovascular fitness.  Maintain a health body weight.  Avoid stress on the plantar fascia.  Wear properly fitted shoes, including arch supports for individuals who have flat feet. PROGNOSIS  If treated properly, then the symptoms of plantar fasciitis usually resolve without surgery. However, occasionally surgery is necessary. RELATED COMPLICATIONS   Recurrent symptoms that may result in a chronic condition.  Problems of the lower back that are caused by compensating for the injury, such as limping.  Pain or weakness of the foot during push-off following surgery.  Chronic inflammation, scarring, and partial or complete fascia tear, occurring more often from repeated  injections. TREATMENT  Treatment initially involves the use of ice and medication to help reduce pain and inflammation. The use of strengthening and stretching exercises may help reduce pain with activity, especially stretches of the Achilles tendon. These exercises may be performed at home or with a therapist. Your caregiver may recommend that you use heel cups of arch supports to help reduce stress on the plantar fascia. Occasionally, corticosteroid injections are given to reduce inflammation. If symptoms persist for greater than 6 months despite non-surgical (conservative), then surgery may be recommended.  MEDICATION   If pain medication is necessary, then nonsteroidal anti-inflammatory medications, such as aspirin and ibuprofen, or other minor pain relievers, such as acetaminophen, are often recommended.  Do not take pain medication within 7 days before surgery.  Prescription pain relievers may be given if deemed necessary by your caregiver. Use only as directed and only as much as you need.  Corticosteroid injections may be given by your caregiver. These injections should be reserved for the most serious cases, because they may only be given a certain number of times. HEAT AND COLD  Cold treatment (icing) relieves pain and reduces inflammation. Cold treatment should be applied for 10 to 15 minutes every 2 to 3 hours for inflammation and pain and immediately after any activity that aggravates your symptoms. Use ice packs or massage the area with a piece of ice (ice massage).  Heat treatment may be used prior to performing the stretching and strengthening activities prescribed by your caregiver, physical therapist, or athletic trainer. Use a heat pack or soak the injury in warm water. SEEK IMMEDIATE MEDICAL CARE IF:  Treatment seems to offer no benefit,  or the condition worsens.  Any medications produce adverse side effects. EXERCISES RANGE OF MOTION (ROM) AND STRETCHING EXERCISES -  Plantar Fasciitis (Heel Spur Syndrome) These exercises may help you when beginning to rehabilitate your injury. Your symptoms may resolve with or without further involvement from your physician, physical therapist or athletic trainer. While completing these exercises, remember:   Restoring tissue flexibility helps normal motion to return to the joints. This allows healthier, less painful movement and activity.  An effective stretch should be held for at least 30 seconds.  A stretch should never be painful. You should only feel a gentle lengthening or release in the stretched tissue. RANGE OF MOTION - Toe Extension, Flexion  Sit with your right / left leg crossed over your opposite knee.  Grasp your toes and gently pull them back toward the top of your foot. You should feel a stretch on the bottom of your toes and/or foot.  Hold this stretch for __________ seconds.  Now, gently pull your toes toward the bottom of your foot. You should feel a stretch on the top of your toes and or foot.  Hold this stretch for __________ seconds. Repeat __________ times. Complete this stretch __________ times per day.  RANGE OF MOTION - Ankle Dorsiflexion, Active Assisted  Remove shoes and sit on a chair that is preferably not on a carpeted surface.  Place right / left foot under knee. Extend your opposite leg for support.  Keeping your heel down, slide your right / left foot back toward the chair until you feel a stretch at your ankle or calf. If you do not feel a stretch, slide your bottom forward to the edge of the chair, while still keeping your heel down.  Hold this stretch for __________ seconds. Repeat __________ times. Complete this stretch __________ times per day.  STRETCH - Gastroc, Standing  Place hands on wall.  Extend right / left leg, keeping the front knee somewhat bent.  Slightly point your toes inward on your back foot.  Keeping your right / left heel on the floor and your knee  straight, shift your weight toward the wall, not allowing your back to arch.  You should feel a gentle stretch in the right / left calf. Hold this position for __________ seconds. Repeat __________ times. Complete this stretch __________ times per day. STRETCH - Soleus, Standing  Place hands on wall.  Extend right / left leg, keeping the other knee somewhat bent.  Slightly point your toes inward on your back foot.  Keep your right / left heel on the floor, bend your back knee, and slightly shift your weight over the back leg so that you feel a gentle stretch deep in your back calf.  Hold this position for __________ seconds. Repeat __________ times. Complete this stretch __________ times per day. STRETCH - Gastrocsoleus, Standing  Note: This exercise can place a lot of stress on your foot and ankle. Please complete this exercise only if specifically instructed by your caregiver.   Place the ball of your right / left foot on a step, keeping your other foot firmly on the same step.  Hold on to the wall or a rail for balance.  Slowly lift your other foot, allowing your body weight to press your heel down over the edge of the step.  You should feel a stretch in your right / left calf.  Hold this position for __________ seconds.  Repeat this exercise with a slight bend in your right /  left knee. Repeat __________ times. Complete this stretch __________ times per day.  STRENGTHENING EXERCISES - Plantar Fasciitis (Heel Spur Syndrome)  These exercises may help you when beginning to rehabilitate your injury. They may resolve your symptoms with or without further involvement from your physician, physical therapist or athletic trainer. While completing these exercises, remember:   Muscles can gain both the endurance and the strength needed for everyday activities through controlled exercises.  Complete these exercises as instructed by your physician, physical therapist or athletic trainer.  Progress the resistance and repetitions only as guided. STRENGTH - Towel Curls  Sit in a chair positioned on a non-carpeted surface.  Place your foot on a towel, keeping your heel on the floor.  Pull the towel toward your heel by only curling your toes. Keep your heel on the floor.  If instructed by your physician, physical therapist or athletic trainer, add ____________________ at the end of the towel. Repeat __________ times. Complete this exercise __________ times per day. STRENGTH - Ankle Inversion  Secure one end of a rubber exercise band/tubing to a fixed object (table, pole). Loop the other end around your foot just before your toes.  Place your fists between your knees. This will focus your strengthening at your ankle.  Slowly, pull your big toe up and in, making sure the band/tubing is positioned to resist the entire motion.  Hold this position for __________ seconds.  Have your muscles resist the band/tubing as it slowly pulls your foot back to the starting position. Repeat __________ times. Complete this exercises __________ times per day.  Document Released: 11/19/2005 Document Revised: 02/11/2012 Document Reviewed: 03/03/2009 [facility] Patient Information 2015 [place], [place]. This information is not intended to replace advice given to you by your health care provider. Make sure you discuss any questions you have with your health care provider.

## 2015-02-23 NOTE — Telephone Encounter (Signed)
Patient was contacted and informed that Dr. Brett Fairy had requested a follow up visit to go over the results of her NPSG/MSLT.  The patient scheduled her appointment for 03/03/15 at 3:30 pm.  Chari Manning was faxed a copy of the report.

## 2015-02-27 NOTE — Progress Notes (Signed)
Patient ID: Robin Russo, female   DOB: May 11, 1976, 39 y.o.   MRN: [id number]  Subjective: 39 year old female returns the office for follow up evaluation of right heel pain, plantar fasciitis. She states that after the injection and the taping after last appointment she had resolution of symptoms. She does that if the day she started having recurrence of pain to her heel. She also states that she has purchased over-the-counter inserts which she believes may be aggravating the symptoms. She denies any numbness or tingling or any swelling or redness overlying the area. Denies any recent injury or trauma. No other complaints at this time.  Objective: AAO 3, NAD DP/PT pulses palpable, CRT less than 3 seconds Protective sensation intact with Simms Weinstein monofilament, vibratory sensation intact, Achilles tendon reflex intact. Negative Tinel sign. There is mild tenderness to palpation along the plantar medial tubercle of the calcaneus at the insertion of the plantar fascia on the right foot. The symptoms do appear to be decreased compared last appointment however. There is no pain along the course of the plantar fascial in the arch of the foot in the ligament appears to be intact. There is no pain with lateral compression of the calcaneus or pain with vibratory sensation. No pain on the posterior aspect of the calcaneus or along the course/insertion of the Achilles tendon. No other areas of tenderness to bilateral lower extremities. No overlying edema, erythema, increase in warmth bilaterally. MMT 5/5, ROM WNL No open lesions or pre-ulcerative lesions are identified bilaterally. No pain with calf compression, swelling, warmth, erythema.  Assessment: 39 year old female right heel pain, plantar fasciitis  Plan: -Treatment options were discussed the alternatives, risks, complications. -Patient elects to proceed with steroid injection into the left heel. Under sterile skin preparation, a total of  2.5cc of kenalog 10, 0.5% Marcaine plain, and 2% lidocaine plain were infiltrated into the symptomatic area without complication. A band-aid was applied. Patient tolerated the injection well without complication. Post-injection care with discussed with the patient. Discussed with the patient to ice the area over the next couple of days to help prevent a steroid flare.  -Plantar fascial taping was applied. -Discussed with her that if she continues with the over-the-counter inserts to remove the inserts for her shoe to decrease the bulk also she should increase the activity slowly break them in. -Continue stretching and icing. -Anti-inflammatories as needed. -Continue to wear supportive shoe at all times. -Follow-up in 3 weeks or sooner if any problems are to arise. In the meantime encouraged to call the office with any questions, concerns, change in symptoms.

## 2015-03-03 ENCOUNTER — Ambulatory Visit (INDEPENDENT_AMBULATORY_CARE_PROVIDER_SITE_OTHER): Payer: Medicare HMO | Admitting: Neurology

## 2015-03-03 ENCOUNTER — Encounter: Payer: Self-pay | Admitting: Neurology

## 2015-03-03 VITALS — BP 138/80 | HR 76 | Resp 20 | Ht 64.0 in | Wt 251.2 lb

## 2015-03-03 DIAGNOSIS — G47411 Narcolepsy with cataplexy: Secondary | ICD-10-CM | POA: Insufficient documentation

## 2015-03-03 MED ORDER — SODIUM OXYBATE 500 MG/ML PO SOLN: ORAL | Status: DC

## 2015-03-03 NOTE — Progress Notes (Signed)
SLEEP MEDICINE CLINIC   Provider:  Larey Seat, M D  Referring Provider: Lance Bosch, NP Primary Care Physician:  Chari Manning, NP  Chief Complaint  Patient presents with  . RV sleep    Rm 11, alone    HPI:  Robin Russo is a 39 y.o. female ,  seen here as a referral  from Dr. Feliciana Rossetti for a sleep consultation,   Robin Russo Is here today as a new patient consultation for an narcolepsy chief complaint. She was followed at [facility] by Dr. Alvira Monday for her sleep condition, but her doctor recently moved and she is trying to establish herself with a new sleep medicine specialist.  The patient has a lifelong history of excessive daytime sleepiness and today's Epworth score at 18 points which is very high. She also has a high fatigue degree of fatigue severity scale was endorsed at 54 points today 01-14-15. Robin Russo was diagnosed with breast cancer in the year 2003 and has been followed for this regularly by her oncologists.  She also suffers from osteoarthritis in her knees and lower back and takes pain medication for this.  She has been treated with armodafinil  ( Nuvigil)  At 250 mg for daytime sleepiness,  was also placed on Wellbutrin as the idea to give her a more alerting wakefulness promoting medication for depression. Her narcolepsy manifested as sleep attacks while she was working. She had been is sleepier individual for much of her life even in school 8. 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 She fell asleep standing and typing. She has also associated cataplexy spells. Her knees will buckle and her heart will palpitate. She feels this kind of muscle tone loss associated with emotional triggers. 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 However,  first manifestations of cataplexy were noted in her early 48s.  The patient  reports that she first underwent a regular polysomnography study at [facility], which determined that she had no apnea and no other physiological reason to wake up from sleep.  Her sleep was described as not sound and she had 37 interruptions or arousals from sleep. She then returned for a sleep study to be followed by an MS LT,  a multiple sleep latency test. This revealed a very short sleep latency and REM sleep onsets. I do not have her results here to quote from. It was after the M SLT that the patient started on Nuvigil. Obviously Nuvigil alone is not keeping her sleepiness under control, does not treat her fatigue and she still experiences cataplectic attacks. We will have to discuss today a change in medication a possible use of Xyrem also Xyrem cannot be given alongside and narcotic medication such as hydrocortisone. Xyrem patient's also have to be screened for depression every 3 months and need  liver function tests.The patient is not able to drive as sleepy as she is now..    Interval history 03-03-15 :  [name] underwent a polysomnography followed by M SLT at [facility] sleep at [facility] Date of the study was 02-09-15 date of the MS LT was 02-10-15, The sleep study documented an AHI of 4.3 and an RDI of 11.1. The patient had no significant oxygen desaturations no significant periodic limb movements, a regular heart rate, a normal EEG the study was considered  valid for an M SLT to follow the could not wean the patient off all REM suppressant medications prior to the study but was interesting is that her sleep latency for the night study was 41.5 minutes. She also woke up frequently and had more fragmented sleep but I would have expected she did not have a shortened REM latency.  Her MS LT shoulder mean sleep latency of 2.6 but no REM sleeps. 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 My goal would be for a patient of this caliber to be treated with Xyrem. She has not taken hydrocodone in several months, she takes Ultram very rarely and at a single dose. I think she is safe to use Xyrem based on the signs and symptoms. We need to titrate up from next week on.  She mentioned DDD in her neck and has some radiation of pain into the left elbow.        Review of Systems: Out of a complete 14 system review, the patient complains of only the following symptoms, and all other reviewed systems are negative. Excessive daytime sleepiness endorsing an Epworth sleepiness score of 18 points and a high fatigue severity score of 55 points.  This is while being treated on Nuvigil 250  daily and on Wellbutrin 150 mg.  Cataplexy endorsed. Swiss narcolepsy scale -15.  ,  PHQ 9 depression score 6   History   Social History  . Marital Status: Single    Spouse Name: [name]  . Number of Children: [name]  . Years of Education: hs grad   Occupational History  . Disabled    Social History Main Topics  . Smoking status: Never Smoker   . Smokeless tobacco: Never Used  . Alcohol Use: Yes     Comment: ocassionally  . Drug Use: No  . Sexual Activity:    Partners: Male    Birth Control/ Protection: None   Other Topics Concern  . Not on file   Social History Narrative   Caffeine 3 sodas daily avg.    Family History  Problem Relation Age of Onset  . Heart disease Mother   . Diabetes Mother   . Hypertension Mother   . Hyperlipidemia Mother   . Cataracts Mother   . Stroke Mother   . Arthritis Mother   . Heart disease Father   . Hypertension Father   . Hyperlipidemia Father   . Diabetes Father   . Stroke Father   . Arthritis Father   . Cataracts Father     Past Medical History  Diagnosis Date  . Hypertension   . Narcolepsy   . Osteoarthritis   . Anxiety   . Depression   . Acid reflux   . Allergy   . Cancer   .  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 Tonsillectomy    . Cholecystectomy    . Breast lumpectomy      Current Outpatient Prescriptions  Medication Sig Dispense Refill  . acetaminophen (TYLENOL) 650 MG CR tablet Take 650 mg by mouth every 8 (eight) hours as needed for pain.    Marland Kitchen amLODipine (NORVASC) 10 MG tablet Take 10 mg by mouth daily.    . Armodafinil 250 MG tablet Take 1 tablet (250 mg total)  by mouth daily. 30 tablet 5  . atenolol (TENORMIN) 50 MG tablet Take 50 mg by mouth daily.    Marland Kitchen buPROPion (WELLBUTRIN XL) 150 MG 24 hr tablet Take 1 tablet (150 mg total) by mouth daily. 30 tablet 1  . celecoxib (CELEBREX) 200 MG capsule Take 200 mg by mouth daily.    . Cholecalciferol (VITAMIN D PO) Take 1 tablet by mouth daily.    . clindamycin (CLINDAGEL) 1 % gel Apply 1 application topically at bedtime.    . cycloSPORINE (RESTASIS) 0.05 % ophthalmic emulsion Place 1 drop into both eyes 2 (two) times daily.    . DULoxetine (CYMBALTA) 60 MG capsule Take 60 mg by mouth daily.    Marland Kitchen esomeprazole (NEXIUM) 40 MG capsule Take 40 mg by mouth 2 (two) times daily before a meal.    . fluconazole (DIFLUCAN) 150 MG tablet Take 1 tablet (150 mg total) by mouth daily. (Patient taking differently: Take 150 mg by mouth daily. Prn) 1 tablet 1  . ibuprofen (ADVIL,MOTRIN) 800 MG tablet Take 1 tablet (800 mg total) by mouth 2 (two) times daily as needed. 30 tablet 0  . losartan (COZAAR) 100 MG tablet Take 100 mg by mouth daily.    . mometasone (NASONEX) 50 MCG/ACT nasal spray Place 2 sprays into the nose daily. 17 g 12  . Multiple Vitamins-Minerals (MULTIVITAMIN WITH MINERALS) tablet Take 1 tablet by mouth daily.    . Sodium Oxybate 500 MG/ML SOLN Take twice at night, one time in bed at the beginning of the night, second dose 2-3 hours later , by mouth. Titration schedule printed. 270 mL 3   No current facility-administered medications for this visit.     Allergies as of 03/03/2015  . (No Known Allergies)    Vitals: BP 138/80 mmHg  Pulse 76  Resp 20  Ht 5' 4"  (1.626 m)  Wt 251 lb 3.2 oz (113.944 kg)  BMI 43.10 kg/m2 Last Weight:  Wt Readings from Last 1 Encounters:  03/03/15 251 lb 3.2 oz (113.944 kg)       Last Height:   Ht Readings from Last 1 Encounters:  03/03/15 5' 4"  (1.626 m)    Physical exam:  General: The patient is awake, alert and appears not in acute distress. The patient is well groomed. Head: Normocephalic, atraumatic. Neck is supple. Mallampati   2 - wide open , she has further  gained weight.  neck circumference:16.25 . Nasal airflow unrestricted, TMJ is  Not  evident . Retrognathia is seen.  Cardiovascular:  Regular rate and rhythm , without  murmurs or carotid bruit. Respiratory: Lungs are clear to auscultation. Skin:  Without evidence of edema, or rash Trunk: BMI is elevated and patient  has normal posture.  Neurologic exam : The patient is awake and alert, oriented to place and time.   Memory subjective  described as intact. There is a normal attention span & concentration ability.  Speech is fluent without  dysarthria, dysphonia  Mood and affect are appropriate.  Cranial nerves: Pupils are equal and briskly reactive to light.  Extraocular movements  in vertical and horizontal planes intact and without nystagmus. Visual fields by finger perimetry are intact. Hearing to finger rub intact.  Facial sensation intact to fine touch. Facial motor strength is symmetric and tongue and uvula move midline.  Motor exam:   Normal tone ,muscle bulk and symmetric,strength in all extremities.  Sensory:  Fine touch, pinprick and vibration were tested in all extremities.  Proprioception is  normal.  Coordination: Rapid alternating movements in the fingers/hands is normal.  Finger-to-nose maneuver  normal without evidence of ataxia, dysmetria or tremor.  Gait and station: Patient walks without assistive device and  is able  (unassisted) to climb up to the exam table.  Strength within normal limits. Stance is stable and normal. Tandem gait is unfragmented. Romberg testing is  negative.  Deep tendon reflexes: in the  upper and lower extremities are symmetric and intact. Babinski  downgoing.   Assessment:  After physical and neurologic examination, review of laboratory studies, imaging, neurophysiology testing and pre-existing records, assessment is   1) narcolepsy with cataplexy - diagnosed in 2010?  It was after the M SLT that the patient started on Nuvigil. Obviously Nuvigil alone is not keeping her sleepiness under control, does not treat her fatigue and she still experiences cataplectic attacks. Insomnia at night, vivid dreams in " technicolor" , she has been kicking and hitting, has been napping with dreams.   We will have to discuss today a change in medication a possible use of Xyrem also Xyrem cannot be given alongside and narcotic medication such as hydrocortisone. Xyrem patient's also have to be screened for depression every 3 months and need  liver function tests.The patient is not able to drive as sleepy as she is now. 2) depression  3) breast cancer  4) pain- the patient assured me that she is not longer taken any narcotic pain medications. I will take the narco off her medication list. She can not receive any narcotic prescriptions while on XYREM>     The patient was advised of the nature of the diagnosed sleep disorder , the treatment options and risks for general a health and wellness arising from not treating the condition. Visit duration was 45 minutes, with 50 % of the face to face time used to explain the condition, treatment options for narcolepsy with cataplexy and especially the risks associated with XYREM as the most potent medication for the condition.  Plan:  Treatment plan and additional workup :  PSG with MSLT after being weaned off Wellbutrin.  HLA test for narcolepsy/  Intiate  XYREM after liver function tests.    I will change Wellbutrin to imipramine / Tofranil for cataplexy control now. refilll Nuvigil for now.  I have asked for paper referral of previous MSLT ("1 minutes sleep latency")     Larey Seat MD  03/03/2015

## 2015-03-07 ENCOUNTER — Telehealth: Payer: Self-pay | Admitting: *Deleted

## 2015-03-07 NOTE — Telephone Encounter (Signed)
Received records from [facility] requested 03-17-15.

## 2015-03-16 ENCOUNTER — Ambulatory Visit: Payer: Medicare HMO | Admitting: Podiatry

## 2015-03-21 ENCOUNTER — Telehealth: Payer: Self-pay | Admitting: Neurology

## 2015-03-21 NOTE — Telephone Encounter (Signed)
I called Xyrem they are processing still enrollment and conducting benefit investigation, but will be in touch with patient this week.  They had to change everything from old system to new Xyrem Rems system.  I called and spoke with patient.  She is aware.

## 2015-03-21 NOTE — Telephone Encounter (Signed)
Pt is calling stating she is still waiting on her Xyrem to be delivered.  Please call and advise.

## 2015-03-24 ENCOUNTER — Encounter: Payer: Medicare FFS | Admitting: Obstetrics & Gynecology

## 2015-03-28 ENCOUNTER — Telehealth: Payer: Self-pay | Admitting: Neurology

## 2015-03-28 NOTE — Telephone Encounter (Signed)
I called back.  Spoke with [employer].  She said they have counseled the patient and advised not to consume Alcohol when taking Xyrem.  They will dispense meds as ordered and will call us back if anything further is needed.

## 2015-03-28 NOTE — Telephone Encounter (Signed)
Error. Gaynelle already took a message.

## 2015-03-28 NOTE — Telephone Encounter (Signed)
Robin Russo with [facility] @ [phone], requesting approval to ship Xyrem knowing patient consumes alcohol occasionally.  Please call and advise.

## 2015-03-28 NOTE — Telephone Encounter (Signed)
States that the patient does consume alcohol and was advised the patient to wait 24hrs until taking the medication. She just wants an approval to give script to patient.

## 2015-04-04 ENCOUNTER — Telehealth: Payer: Self-pay | Admitting: *Deleted

## 2015-04-04 NOTE — Telephone Encounter (Signed)
I called pt to remind her of her appt for tomorrow with Dr. Brett Fairy pt stated she needed to reschedule because she has not started taking the medication that Dr. Brett Fairy gave her last week, I rescheduled pt for the first available 05/03/15 with Dr. Brett Fairy and I also put pt on the wait list. Pt stated she didn't think Dr. Brett Fairy would want her to wait that long to be seen. Please advise

## 2015-04-05 ENCOUNTER — Ambulatory Visit: Payer: Medicare HMO | Admitting: Neurology

## 2015-04-06 ENCOUNTER — Telehealth: Payer: Self-pay

## 2015-04-06 NOTE — Telephone Encounter (Signed)
Called pt to ask her about her xyrem and if she needed a sooner appt. She says she still has not started her xyrem yet but plans to start tonight. Explained to pt we need to see her 4-6 weeks after starting xyrem to check labs. Pt said to keep the 5/31 appt. Encouraged her to call back with any questions or concerns.

## 2015-04-08 ENCOUNTER — Ambulatory Visit (INDEPENDENT_AMBULATORY_CARE_PROVIDER_SITE_OTHER): Payer: Medicare HMO | Admitting: Podiatry

## 2015-04-08 ENCOUNTER — Encounter: Payer: Self-pay | Admitting: Podiatry

## 2015-04-08 VITALS — BP 159/86 | HR 92 | Resp 12

## 2015-04-08 DIAGNOSIS — M722 Plantar fascial fibromatosis: Secondary | ICD-10-CM | POA: Diagnosis not present

## 2015-04-09 ENCOUNTER — Other Ambulatory Visit: Payer: Self-pay | Admitting: Podiatry

## 2015-04-09 NOTE — Progress Notes (Signed)
Patient ID: Robin Russo, female   DOB: Apr 20, 1976, 39 y.o.   MRN: [id number]  Subjective: 39 year old female presents the office they for follow-up evaluation of right heel pain, plantar fasciitis. She doesn't since last appointment she continues to be improving although she does continue to have some pain. She continues of the stretching icing activities as much as possible and when the plantar fascial brace. She denies any numbness or to leave. Denies any swelling or redness. Pain does not wake her up at night. Denies any systemic complaints such as fevers, chills, nausea, vomiting. No acute changes since last appointment, and no other complaints at this time.   Objective: AAO x3, NAD DP/PT pulses palpable bilaterally, CRT less than 3 seconds Protective sensation intact with Simms Weinstein monofilament, vibratory sensation intact, Achilles tendon reflex intact, negative tinel sign There is continued mild tenderness to palpation overlying the plantar medial tubercle of the calcaneus to right heel at the insertion of the plantar fascia although it does appear to be resolving. There is no pain along the course of plantar fascial within the arch. There is no pain with lateral compression of the calcaneus or pain the vibratory sensation. No pain on the posterior aspect of the calcaneus or along the course/insertion of the Achilles tendon. There is no overlying edema, erythema, increase in warmth. No other areas of tenderness palpation or pain with vibratory sensation to the foot/ankle. MMT 5/5, ROM WNL. Equinus is present and there is subjective "tightness" to the plantar fascia when trying to dorsiflex the ankle.  No open lesions or pre-ulcerative lesions are identified. No pain with calf compression, swelling, warmth, erythema.  Assessment: 39 year old female with plantar fasciitis.   Plan: -All treatment options discussed with the patient including all alternatives, risks, complications.   -Discussed the third steroid injection however the patient wishes to hold off at this time. -Dispensed night splint. -Continue plantar fascial brace to the day. -Continue stretching icing activities. -Discussed shoe gear modifications. -Continue anti-inflammatory -Follow up in 3 weeks or sooner if any problems are to arise.  -Patient encouraged to call the office with any questions, concerns, change in symptoms.

## 2015-04-12 ENCOUNTER — Telehealth: Payer: Self-pay

## 2015-04-12 NOTE — Telephone Encounter (Signed)
[employer] approved the request for coverage on Xyrem effective 02/01/15-05/04/15 Ref # [id number] An additional approval has been granted effective until 84/21/0312 or until policy changes or is terminated Ref # [id number] Patient's [employer] ID # is [id number]

## 2015-04-15 ENCOUNTER — Other Ambulatory Visit (HOSPITAL_COMMUNITY)
Admission: RE | Admit: 2015-04-15 | Discharge: 2015-04-15 | Disposition: A | Discharge status: Home / Self Care | Payer: Medicare HMO | Source: Ambulatory Visit | Attending: Family Medicine | Admitting: Family Medicine

## 2015-04-15 ENCOUNTER — Ambulatory Visit (INDEPENDENT_AMBULATORY_CARE_PROVIDER_SITE_OTHER): Payer: Medicare HMO | Admitting: Family Medicine

## 2015-04-15 ENCOUNTER — Encounter: Payer: Self-pay | Admitting: Family Medicine

## 2015-04-15 VITALS — BP 138/83 | HR 68 | Temp 97.6°F | Ht 65.0 in | Wt 250.4 lb

## 2015-04-15 DIAGNOSIS — Z1151 Encounter for screening for human papillomavirus (HPV): Secondary | ICD-10-CM | POA: Diagnosis not present

## 2015-04-15 DIAGNOSIS — Z124 Encounter for screening for malignant neoplasm of cervix: Secondary | ICD-10-CM

## 2015-04-15 DIAGNOSIS — N915 Oligomenorrhea, unspecified: Secondary | ICD-10-CM | POA: Diagnosis present

## 2015-04-15 DIAGNOSIS — Z853 Personal history of malignant neoplasm of breast: Secondary | ICD-10-CM | POA: Diagnosis not present

## 2015-04-15 DIAGNOSIS — N644 Mastodynia: Secondary | ICD-10-CM

## 2015-04-15 LAB — TSH: TSH: 0.809 u[IU]/mL (ref 0.350–4.500)

## 2015-04-15 NOTE — Progress Notes (Signed)
Subjective:    Patient ID: Robin Russo, female    DOB: [birth year], 39 y.o.   MRN: [id number]  HPI Patient referred to our clinic for oligomenorrhea.  Patient has had infrequent irregular periods for many years. Additionally, she had been diagnosed with PCOS several years ago and was started on metformin, although she been treated empirically and without testing.  She does complain of facial hair, which started when she was teenager and has progressed and increased over time. There have been no palliating or provoking factors. 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 She has seen a dermatologist who recommended the use of Rogaine, although she has never started this.  She has a history of left-sided breast cancer status post lumpectomy, chemotherapy, radiation therapy. She complains of tenderness to her breast and radiated into her left axilla and arm. Pain was an aching pain. There is tenderness to palpation. No palliating or provoking factors.  Review of Systems  Constitutional: Negative for fever, chills and fatigue.  Respiratory: Negative for cough, chest tightness, shortness of breath and wheezing.   Cardiovascular: Negative for chest pain and palpitations.  Gastrointestinal: Negative for nausea, vomiting, abdominal pain, diarrhea and constipation.  Genitourinary: Negative for dysuria, urgency, hematuria, decreased urine volume, vaginal bleeding, vaginal discharge, difficulty urinating, vaginal pain and pelvic pain.  All other systems reviewed and are negative.  I have reviewed the patients past medical, family, and social history.  I have reviewed the patient's medication list and allergies.     Objective:   Physical Exam  Constitutional: She is oriented to person, place, and time. She appears well-developed and well-nourished.  HENT:  Head: Normocephalic and atraumatic.  Right Ear: External ear  normal.  Left Ear: External ear normal.  Eyes: Conjunctivae and EOM are normal. Pupils are equal, round, and reactive to light.  Neck: Normal range of motion. Neck supple. No tracheal deviation present. No thyromegaly present.  Cardiovascular: Normal rate, regular rhythm, normal heart sounds and intact distal pulses.  Exam reveals no gallop and no friction rub.   No murmur heard. Pulmonary/Chest: Effort normal and breath sounds normal. No respiratory distress. She has no wheezes. She has no rales. She exhibits no tenderness.    Abdominal: Soft. Bowel sounds are normal. She exhibits no distension and no mass. There is no tenderness. There is no rebound and no guarding. Hernia confirmed negative in the right inguinal area and confirmed negative in the left inguinal area.  Genitourinary: Rectal exam shows no external hemorrhoid. There is breast tenderness. No breast swelling, discharge or bleeding. No labial fusion. There is no rash, tenderness, lesion or injury on the right labia. There is no rash, tenderness, lesion or injury on the left labia. Uterus is not deviated, not enlarged, not fixed and not tender. Cervix exhibits no motion tenderness, no discharge and no friability. Right adnexum displays no mass, no tenderness and no fullness. Left adnexum displays no mass, no tenderness and no fullness. No erythema, tenderness or bleeding in the vagina. No foreign body around the vagina. No signs of injury around the vagina. No vaginal discharge found.  Lymphadenopathy:    She has no cervical adenopathy.       Right: No inguinal adenopathy present.       Left: No inguinal adenopathy present.  Neurological: She is alert and oriented to person, place, and time.  Skin: Skin is warm and dry. No rash noted. No erythema.  No pallor.  Psychiatric: She has a normal mood and affect. Her behavior is normal. Judgment and thought content normal.      Assessment & Plan:   Problem List Items Addressed This Visit      HX: breast cancer    Other Visit Diagnoses    Oligomenorrhea    -  Primary    Relevant Orders    FSH    LH    Testosterone    Testosterone, free    TSH    US Pelvis Complete    Sex hormone binding globulin    Breast pain, left        Relevant Orders    MM DIAG BREAST TOMO BILATERAL    US BREAST LTD UNI LEFT INC AXILLA    Screening for cervical cancer        Relevant Orders    Cytology - PAP       there is a high probability that the patient's oligomenorrhea is related to PCO S. Will obtain FSH, LH, testosterone levels. We'll also obtain TSH to assure that this is not a thyroid problem. Additionally, will obtain an pelvic ultrasound to evaluate the patient's ovaries.  In regards to the patient's breast pain, we'll obtain up ultrasound and diagnostic rest mammogram. Further, patient in need of cervical cancer screening and  Pap smear was obtained.

## 2015-04-15 NOTE — Patient Instructions (Signed)
Polycystic Ovarian Syndrome Polycystic ovarian syndrome (PCOS) is a common hormonal disorder among women of reproductive age. Most women with PCOS grow many small cysts on their ovaries. PCOS can cause problems with your periods and make it difficult to get pregnant. It can also cause an increased risk of miscarriage with pregnancy. If left untreated, PCOS can lead to serious health problems, such as diabetes and heart disease. CAUSES The cause of PCOS is not fully understood, but genetics may be a factor. SIGNS AND SYMPTOMS   Infrequent or no menstrual periods.   Inability to get pregnant (infertility) because of not ovulating.   Increased growth of hair on the face, chest, stomach, back, thumbs, thighs, or toes.   Acne, oily skin, or dandruff.   Pelvic pain.   Weight gain or obesity, usually carrying extra weight around the waist.   Type 2 diabetes.   High cholesterol.   High blood pressure.   Female-pattern baldness or thinning hair.   Patches of thickened and dark brown or black skin on the neck, arms, breasts, or thighs.   Tiny excess flaps of skin (skin tags) in the armpits or neck area.   Excessive snoring and having breathing stop at times while asleep (sleep apnea).   Deepening of the voice.   Gestational diabetes when pregnant.  DIAGNOSIS  There is no single test to diagnose PCOS.   Your health care provider will:   Take a medical history.   Perform a pelvic exam.   Have ultrasonography done.   Check your female and female hormone levels.   Measure glucose or sugar levels in the blood.   Do other blood tests.   If you are producing too many female hormones, your health care provider will make sure it is from PCOS. At the physical exam, your health care provider will want to evaluate the areas of increased hair growth. Try to allow natural hair growth for a few days before the visit.   During a pelvic exam, the ovaries may be enlarged  or swollen because of the increased number of small cysts. This can be seen more easily by using vaginal ultrasonography or screening to examine the ovaries and lining of the uterus (endometrium) for cysts. The uterine lining may become thicker if you have not been having a regular period.  TREATMENT  Because there is no cure for PCOS, it needs to be managed to prevent problems. Treatments are based on your symptoms. Treatment is also based on whether you want to have a baby or whether you need contraception.  Treatment may include:   Progesterone hormone to start a menstrual period.   Birth control pills to make you have regular menstrual periods.   Medicines to make you ovulate, if you want to get pregnant.   Medicines to control your insulin.   Medicine to control your blood pressure.   Medicine and diet to control your high cholesterol and triglycerides in your blood.  Medicine to reduce excessive hair growth.  Surgery, making small holes in the ovary, to decrease the amount of female hormone production. This is done through a long, lighted tube (laparoscope) placed into the pelvis through a tiny incision in the lower abdomen.  HOME CARE INSTRUCTIONS  Only take over-the-counter or prescription medicine as directed by your health care provider.  Pay attention to the foods you eat and your activity levels. This can help reduce the effects of PCOS.  Keep your weight under control.  Eat foods that are   take over-the-counter or prescription medicine as directed by your health care provider.   Pay attention to the foods you eat and your activity levels. This can help reduce the effects of PCOS.   Keep your weight under control.   Eat foods that are low in carbohydrate and high in fiber.   Exercise regularly.  SEEK MEDICAL CARE IF:   Your symptoms do not get better with medicine.   You have new symptoms.  Document Released: 03/15/2005 Document Revised: 09/09/2013 Document Reviewed: 05/07/2013  ExitCare Patient Information 2015 [employer]. This information is not intended to replace advice given to you by your health care provider. Make sure you discuss any questions you have with your health care provider.

## 2015-04-16 LAB — TESTOSTERONE: Testosterone: 29 ng/dL (ref 10–70)

## 2015-04-16 LAB — SEX HORMONE BINDING GLOBULIN: Sex Hormone Binding: 61 nmol/L (ref 17–124)

## 2015-04-16 LAB — LUTEINIZING HORMONE: LH: 13.7 m[IU]/mL

## 2015-04-16 LAB — FOLLICLE STIMULATING HORMONE: FSH: 29.5 m[IU]/mL

## 2015-04-18 LAB — TESTOSTERONE, FREE: TESTOSTERONE FREE: 3.5 pg/mL (ref 0.6–6.8)

## 2015-04-18 LAB — CYTOLOGY - PAP

## 2015-04-19 ENCOUNTER — Ambulatory Visit
Admission: RE | Admit: 2015-04-19 | Discharge: 2015-04-19 | Disposition: A | Discharge status: Home / Self Care | Payer: Medicare HMO | Source: Ambulatory Visit | Attending: Family Medicine | Admitting: Family Medicine

## 2015-04-19 ENCOUNTER — Other Ambulatory Visit: Payer: Self-pay | Admitting: Family Medicine

## 2015-04-19 DIAGNOSIS — N644 Mastodynia: Secondary | ICD-10-CM

## 2015-04-24 ENCOUNTER — Emergency Department (HOSPITAL_COMMUNITY)
Admission: EM | Admit: 2015-04-24 | Discharge: 2015-04-24 | Disposition: A | Discharge status: Home / Self Care | Payer: Medicare HMO | Attending: Emergency Medicine | Admitting: Emergency Medicine

## 2015-04-24 ENCOUNTER — Encounter (HOSPITAL_COMMUNITY): Payer: Self-pay | Admitting: Emergency Medicine

## 2015-04-24 DIAGNOSIS — F419 Anxiety disorder, unspecified: Secondary | ICD-10-CM | POA: Insufficient documentation

## 2015-04-24 DIAGNOSIS — M1990 Unspecified osteoarthritis, unspecified site: Secondary | ICD-10-CM | POA: Insufficient documentation

## 2015-04-24 DIAGNOSIS — I10 Essential (primary) hypertension: Secondary | ICD-10-CM | POA: Diagnosis not present

## 2015-04-24 DIAGNOSIS — H9203 Otalgia, bilateral: Secondary | ICD-10-CM | POA: Insufficient documentation

## 2015-04-24 DIAGNOSIS — R079 Chest pain, unspecified: Secondary | ICD-10-CM | POA: Diagnosis not present

## 2015-04-24 DIAGNOSIS — Z859 Personal history of malignant neoplasm, unspecified: Secondary | ICD-10-CM | POA: Diagnosis not present

## 2015-04-24 DIAGNOSIS — Z79899 Other long term (current) drug therapy: Secondary | ICD-10-CM | POA: Diagnosis not present

## 2015-04-24 DIAGNOSIS — J069 Acute upper respiratory infection, unspecified: Secondary | ICD-10-CM | POA: Diagnosis not present

## 2015-04-24 DIAGNOSIS — G47411 Narcolepsy with cataplexy: Secondary | ICD-10-CM | POA: Diagnosis not present

## 2015-04-24 DIAGNOSIS — K219 Gastro-esophageal reflux disease without esophagitis: Secondary | ICD-10-CM | POA: Diagnosis not present

## 2015-04-24 DIAGNOSIS — Z791 Long term (current) use of non-steroidal anti-inflammatories (NSAID): Secondary | ICD-10-CM | POA: Insufficient documentation

## 2015-04-24 DIAGNOSIS — F329 Major depressive disorder, single episode, unspecified: Secondary | ICD-10-CM | POA: Insufficient documentation

## 2015-04-24 DIAGNOSIS — J029 Acute pharyngitis, unspecified: Secondary | ICD-10-CM | POA: Diagnosis present

## 2015-04-24 LAB — RAPID STREP SCREEN (MED CTR MEBANE ONLY): Streptococcus, Group A Screen (Direct): NEGATIVE

## 2015-04-24 MED ORDER — IBUPROFEN 800 MG PO TABS
800.0000 mg | ORAL_TABLET | Freq: Once | ORAL | Status: DC
  Filled 2015-04-24: qty 1

## 2015-04-24 MED ORDER — GUAIFENESIN-CODEINE 100-10 MG/5ML PO SOLN: 10.0000 mL | Freq: Four times a day (QID) | ORAL | Status: DC | PRN

## 2015-04-24 MED ORDER — IBUPROFEN 800 MG PO TABS: 800.0000 mg | ORAL_TABLET | Freq: Three times a day (TID) | ORAL | Status: DC | PRN

## 2015-04-24 MED ORDER — BENZONATATE 100 MG PO CAPS: 100.0000 mg | ORAL_CAPSULE | Freq: Three times a day (TID) | ORAL | Status: DC | PRN

## 2015-04-24 MED ORDER — FLUTICASONE PROPIONATE 50 MCG/ACT NA SUSP: 2.0000 | Freq: Every day | NASAL | Status: DC

## 2015-04-24 MED ORDER — GUAIFENESIN-CODEINE 100-10 MG/5ML PO SOLN
10.0000 mL | Freq: Once | ORAL | Status: AC
Start: 2015-04-24 — End: 2015-04-24
  Administered 2015-04-24: 10 mL via ORAL
  Filled 2015-04-24: qty 10

## 2015-04-24 NOTE — ED Notes (Signed)
Pt reports a cough that started on Friday. Pt states that she has taken many OTC medications, but nothing has helped. Pt also reports a sore throat and bilateral ear pain.

## 2015-04-24 NOTE — Discharge Instructions (Signed)
Upper Respiratory Infection, Adult An upper respiratory infection (URI) is also sometimes known as the common cold. The upper respiratory tract includes the nose, sinuses, throat, trachea, and bronchi. Bronchi are the airways leading to the lungs. Most people improve within 1 week, but symptoms can last up to 2 weeks. A residual cough may last even longer.  CAUSES Many different viruses can infect the tissues lining the upper respiratory tract. The tissues become irritated and inflamed and often become very moist. Mucus production is also common. A cold is contagious. You can easily spread the virus to others by oral contact. This includes kissing, sharing a glass, coughing, or sneezing. Touching your mouth or nose and then touching a surface, which is then touched by another person, can also spread the virus. SYMPTOMS  Symptoms typically develop 1 to 3 days after you come in contact with a cold virus. Symptoms vary from person to person. They may include:  Runny nose.  Sneezing.  Nasal congestion.  Sinus irritation.  Sore throat.  Loss of voice (laryngitis).  Cough.  Fatigue.  Muscle aches.  Loss of appetite.  Headache.  Low-grade fever. DIAGNOSIS  You might diagnose your own cold based on familiar symptoms, since most people get a cold 2 to 3 times a year. Your caregiver can confirm this based on your exam. Most importantly, your caregiver can check that your symptoms are not due to another disease such as strep throat, sinusitis, pneumonia, asthma, or epiglottitis. Blood tests, throat tests, and X-rays are not necessary to diagnose a common cold, but they may sometimes be helpful in excluding other more serious diseases. Your caregiver will decide if any further tests are required. RISKS AND COMPLICATIONS  You may be at risk for a more severe case of the common cold if you smoke cigarettes, have chronic heart disease (such as heart failure) or lung disease (such as asthma), or if  you have a weakened immune system. The very young and very old are also at risk for more serious infections. Bacterial sinusitis, middle ear infections, and bacterial pneumonia can complicate the common cold. The common cold can worsen asthma and chronic obstructive pulmonary disease (COPD). Sometimes, these complications can require emergency medical care and may be life-threatening. PREVENTION  The best way to protect against getting a cold is to practice good hygiene. Avoid oral or hand contact with people with cold symptoms. Wash your hands often if contact occurs. There is no clear evidence that vitamin C, vitamin E, echinacea, or exercise reduces the chance of developing a cold. However, it is always recommended to get plenty of rest and practice good nutrition. TREATMENT  Treatment is directed at relieving symptoms. There is no cure. Antibiotics are not effective, because the infection is caused by a virus, not by bacteria. Treatment may include:  Increased fluid intake. Sports drinks offer valuable electrolytes, sugars, and fluids.  Breathing heated mist or steam (vaporizer or shower).  Eating chicken soup or other clear broths, and maintaining good nutrition.  Getting plenty of rest.  Using gargles or lozenges for comfort.  Controlling fevers with ibuprofen or acetaminophen as directed by your caregiver.  Increasing usage of your inhaler if you have asthma. Zinc gel and zinc lozenges, taken in the first 24 hours of the common cold, can shorten the duration and lessen the severity of symptoms. Pain medicines may help with fever, muscle aches, and throat pain. A variety of non-prescription medicines are available to treat congestion and runny nose. Your caregiver   can make recommendations and may suggest nasal or lung inhalers for other symptoms.  HOME CARE INSTRUCTIONS   Only take over-the-counter or prescription medicines for pain, discomfort, or fever as directed by your  caregiver.  Use a warm mist humidifier or inhale steam from a shower to increase air moisture. This may keep secretions moist and make it easier to breathe.  Drink enough water and fluids to keep your urine clear or pale yellow.  Rest as needed.  Return to work when your temperature has returned to normal or as your caregiver advises. You may need to stay home longer to avoid infecting others. You can also use a face mask and careful hand washing to prevent spread of the virus. SEEK MEDICAL CARE IF:   After the first few days, you feel you are getting worse rather than better.  You need your caregiver's advice about medicines to control symptoms.  You develop chills, worsening shortness of breath, or brown or red sputum. These may be signs of pneumonia.  You develop yellow or brown nasal discharge or pain in the face, especially when you bend forward. These may be signs of sinusitis.  You develop a fever, swollen neck glands, pain with swallowing, or white areas in the back of your throat. These may be signs of strep throat. SEEK IMMEDIATE MEDICAL CARE IF:   You have a fever.  You develop severe or persistent headache, ear pain, sinus pain, or chest pain.  You develop wheezing, a prolonged cough, cough up blood, or have a change in your usual mucus (if you have chronic lung disease).  You develop sore muscles or a stiff neck. Document Released: 05/15/2001 Document Revised: 02/11/2012 Document Reviewed: 02/24/2014 Exit[employer] Patient Information 2015 [employer]. This information is not intended to replace advice given to you by your health care provider. Make sure you discuss any questions you have with your health care provider.  

## 2015-04-24 NOTE — ED Provider Notes (Signed)
TIME SEEN: 5:00 AM  CHIEF COMPLAINT: Cough, sore throat, ear pain  HPI: Pt is a 39 y.o. female with history of hypertension who presents to the emergency department with complaints of several days of dry cough, bilateral ear pain and sore throat, nasal drainage. No fever. No sick contacts or recent travel. Has had some chest pain with coughing. No chest pain currently. No shortness of breath.  ROS: See HPI Constitutional: no fever  Eyes: no drainage  ENT:  runny nose   Cardiovascular: Chest pain with coughing Resp: no SOB  GI: no vomiting GU: no dysuria Integumentary: no rash  Allergy: no hives  Musculoskeletal: no leg swelling  Neurological: no slurred speech ROS otherwise negative  PAST MEDICAL HISTORY/PAST SURGICAL HISTORY:  Past Medical History  Diagnosis Date  . Hypertension   . Narcolepsy   . Osteoarthritis   . Anxiety   . Depression   . Acid reflux   . Allergy   . Cancer   . 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 Authorizing Provider  acetaminophen (TYLENOL) 650 MG CR tablet Take 650 mg by mouth every 8 (eight) hours as needed for pain.    Historical Provider, MD  amLODipine (NORVASC) 10 MG tablet Take 10 mg by mouth daily.    Historical Provider, MD  Armodafinil 250 MG tablet Take 1 tablet (250 mg total) by mouth daily. 02/11/15   Asencion Partridge Dohmeier, MD  atenolol (TENORMIN) 50 MG tablet Take 50 mg by mouth daily.    Historical Provider, MD  buPROPion (WELLBUTRIN XL) 150 MG 24 hr tablet Take 1 tablet (150 mg total) by mouth daily. 12/06/14   Lance Bosch, NP  celecoxib (CELEBREX) 200 MG capsule Take 200 mg by mouth daily.    Historical Provider, MD  cetirizine (ZYRTEC) 10 MG tablet Take 10 mg by mouth daily.    Historical Provider, MD  Cholecalciferol (VITAMIN D PO) Take 1 tablet by mouth daily.    Historical Provider, MD  clindamycin (CLINDAGEL) 1 % gel Apply 1 application  topically at bedtime.    Historical Provider, MD  cycloSPORINE (RESTASIS) 0.05 % ophthalmic emulsion Place 1 drop into both eyes 2 (two) times daily.    Historical Provider, MD  DULoxetine (CYMBALTA) 60 MG capsule Take 60 mg by mouth daily.    Historical Provider, MD  esomeprazole (NEXIUM) 40 MG capsule Take 40 mg by mouth 2 (two) times daily before a meal.    Historical Provider, MD  ibuprofen (ADVIL,MOTRIN) 800 MG tablet TAKE ONE TABLET BY MOUTH TWICE DAILY AS NEEDED 04/13/15   Trula Slade, DPM  losartan (COZAAR) 100 MG tablet Take 100 mg by mouth daily.    Historical Provider, MD  montelukast (SINGULAIR) 5 MG chewable tablet Chew 5 mg by mouth at bedtime.    Historical Provider, MD  Multiple Vitamins-Minerals (MULTIVITAMIN WITH MINERALS) tablet Take 1 tablet by mouth daily.    Historical Provider, MD  Sodium Oxybate 500 MG/ML SOLN Take twice at night, one time in bed at the beginning of the night, second dose 2-3 hours later , by mouth. Titration schedule printed. 03/03/15   Larey Seat, MD    ALLERGIES:  No Known Allergies  SOCIAL HISTORY:  History  Substance Use Topics  . Smoking status: Never Smoker   . Smokeless tobacco: Never Used  . Alcohol Use: Yes     Comment: ocassionally    FAMILY HISTORY:  Family History  Problem Relation Age of Onset  . Heart disease Mother   . Diabetes Mother   . Hypertension Mother   . Hyperlipidemia Mother   . Cataracts Mother   . Stroke Mother   . Arthritis Mother   . Heart disease Father   . Hypertension Father   . Hyperlipidemia Father   . Diabetes Father   . Stroke Father   . Arthritis Father   . Cataracts Father     EXAM: BP 132/75 mmHg  Pulse 76  Temp(Src) 97.6 F (36.4 C) (Oral)  Resp 21  SpO2 100%  LMP 04/05/2015 CONSTITUTIONAL: Alert and oriented and responds appropriately to questions. Well-appearing; well-nourished HEAD: Normocephalic EYES: Conjunctivae clear, PERRL ENT: normal nose; clear rhinorrhea; moist  mucous membranes; pharynx without lesions noted, sinus drainage noted in the posterior oropharynx, no uvular deviation, no trismus or drooling, normal phonation NECK: Supple, no meningismus, no LAD  CARD: RRR; S1 and S2 appreciated; no murmurs, no clicks, no rubs, no gallops RESP: Normal chest excursion without splinting or tachypnea; breath sounds clear and equal bilaterally; no wheezes, no rhonchi, no rales, no hypoxia or respiratory distress, speaking full sentences ABD/GI: Normal bowel sounds; non-distended; soft, non-tender, no rebound, no guarding, no peritoneal signs BACK:  The back appears normal and is non-tender to palpation, there is no CVA tenderness EXT: Normal ROM in all joints; non-tender to palpation; no edema; normal capillary refill; no cyanosis, no calf tenderness or swelling    SKIN: Normal color for age and race; warm NEURO: Moves all extremities equally, sensation to light touch intact diffusely, cranial nerves II through XII intact PSYCH: The patient's mood and manner are appropriate. Grooming and personal hygiene are appropriate.  MEDICAL DECISION MAKING: Patient here with likely viral URI symptoms versus allergies. Lungs are clear to auscultation. She has not had fever. Cough is dry. Have offered chest x-ray but I feel it will likely be normal and she agrees on holding on imaging at this time. She is not having chest pain or shortness of breath currently. Strep test ordered in triage is negative. Patient reports feeling better after guaifenesin with codeine. We'll discharge with perception for the same and Tessalon Perles to use in the daytime for her cough. Have advised her to continue her antihistamines, nasal steroids, ibuprofen. Discussed return precautions. She verbalized understanding and is comfortable with this plan. Discussed why do not feel she needs antibiotics.      [place], DO 04/24/15 [phone]

## 2015-04-26 ENCOUNTER — Ambulatory Visit (HOSPITAL_COMMUNITY): Payer: Medicare HMO

## 2015-04-27 LAB — CULTURE, GROUP A STREP: Strep A Culture: NEGATIVE

## 2015-04-29 ENCOUNTER — Other Ambulatory Visit: Payer: Self-pay | Admitting: Family Medicine

## 2015-04-29 ENCOUNTER — Ambulatory Visit (HOSPITAL_COMMUNITY)
Admission: RE | Admit: 2015-04-29 | Discharge: 2015-04-29 | Disposition: A | Discharge status: Home / Self Care | Payer: Medicare HMO | Source: Ambulatory Visit | Attending: Family Medicine | Admitting: Family Medicine

## 2015-04-29 DIAGNOSIS — E282 Polycystic ovarian syndrome: Secondary | ICD-10-CM | POA: Insufficient documentation

## 2015-04-29 DIAGNOSIS — N915 Oligomenorrhea, unspecified: Secondary | ICD-10-CM | POA: Diagnosis present

## 2015-05-03 ENCOUNTER — Ambulatory Visit: Payer: Medicare HMO | Admitting: Neurology

## 2015-05-03 ENCOUNTER — Telehealth: Payer: Self-pay

## 2015-05-03 NOTE — Telephone Encounter (Signed)
Pt called this am to cancel her 11:15 appt.

## 2015-05-04 ENCOUNTER — Other Ambulatory Visit: Payer: Self-pay | Admitting: Family Medicine

## 2015-05-04 MED ORDER — METFORMIN HCL 500 MG PO TABS: 500.0000 mg | ORAL_TABLET | Freq: Three times a day (TID) | ORAL | Status: AC

## 2015-05-06 ENCOUNTER — Ambulatory Visit (INDEPENDENT_AMBULATORY_CARE_PROVIDER_SITE_OTHER): Payer: Medicare HMO | Admitting: Podiatry

## 2015-05-06 ENCOUNTER — Encounter: Payer: Self-pay | Admitting: Podiatry

## 2015-05-06 VITALS — BP 137/86 | HR 86 | Resp 12

## 2015-05-06 DIAGNOSIS — M722 Plantar fascial fibromatosis: Secondary | ICD-10-CM

## 2015-05-06 MED ORDER — TRIAMCINOLONE ACETONIDE 10 MG/ML IJ SUSP
10.0000 mg | Freq: Once | INTRAMUSCULAR | Status: AC
  Administered 2015-05-06: 10 mg

## 2015-05-06 MED ORDER — IBUPROFEN 800 MG PO TABS: 800.0000 mg | ORAL_TABLET | Freq: Three times a day (TID) | ORAL | Status: DC | PRN

## 2015-05-10 ENCOUNTER — Encounter: Payer: Self-pay | Admitting: Neurology

## 2015-05-12 NOTE — Progress Notes (Signed)
Patient ID: Robin Russo, female   DOB: 07/02/1976, 39 y.o.   MRN: [id number]  Subjective: 39 year old female presents the office they for follow-up evaluation of right foot pain, plantar fasciitis. She states that she is continuing stretching, icing activities as well as the plantar fascial brace and the night splint. She states that she has started to have recurrence an increase in pain compared to last appointment. At this time she is requesting a steroid injection. She denies any numbness or tingling. The pain does not wake her up at night. No swelling or redness. No other complaints at this time. No acute changes since last appointment.  Objective: AAO x3, NAD DP/PT pulses palpable bilaterally, CRT less than 3 seconds Protective sensation intact with Simms Weinstein monofilament, vibratory sensation intact, Achilles tendon reflex intact There is mild increase in tenderness to palpation overlying the plantar medial tubercle of the calcaneus to the right heel at the insertion of the plantar fascia. There is no pain along the course of plantar fascial within the arch of the foot and the plantar fascia appears intact. There is no pain with lateral compression of the calcaneus or pain the vibratory sensation. No pain on the posterior aspect of the calcaneus or along the course/insertion of the Achilles tendon. There is no overlying edema, erythema, increase in warmth. No other areas of tenderness palpation or pain with vibratory sensation to the foot/ankle. MMT 5/5, ROM WNL No open lesions or pre-ulcerative lesions are identified. No pain with calf compression, swelling, warmth, erythema.  Assessment: Continued right foot pain, plantar fasciitis  Plan: -Treatment options discussed including all alternatives, risks, and complications Patient elects to proceed with steroid injection into the right heel. Under sterile skin preparation, a total of 2.5cc of kenalog 10, 0.5% Marcaine plain, and 2%  lidocaine plain were infiltrated into the symptomatic area without complication. A band-aid was applied. Patient tolerated the injection well without complication. Post-injection care with discussed with the patient. Discussed with the patient to ice the area over the next couple of days to help prevent a steroid flare.  -Continue with stretching and icing on a consistent basis -Continue with shoegear modifications again discussed orthotics. -Continue plantar fascial brace and night splint. -If symptoms persist likely for to physical therapy. -Follow up in 3 weeks or sooner if any problems are to arise. In the meantime encouraged to call the office with any questions, concerns, change in symptoms.

## 2015-05-25 ENCOUNTER — Encounter: Payer: Self-pay | Admitting: Family Medicine

## 2015-05-27 ENCOUNTER — Ambulatory Visit: Payer: Medicare HMO | Admitting: Podiatry

## 2015-06-09 ENCOUNTER — Telehealth: Payer: Self-pay | Admitting: Internal Medicine

## 2015-06-09 NOTE — Telephone Encounter (Signed)
buPROPion (WELLBUTRIN XL) 150 MG 24 hr tablet

## 2015-06-14 NOTE — Telephone Encounter (Signed)
Nurse called patient, patient verified date of birth. Patient explains she has to go to doctor on her card and does not come to [facility] any longer. Nurse advised patient to call pharmacy and let them know they are sending a medication refill request to the wrong doctor.  Patient voices understanding and has no further questions at this time.

## 2015-06-24 ENCOUNTER — Ambulatory Visit: Payer: Medicare HMO | Admitting: Podiatry

## 2015-07-11 ENCOUNTER — Encounter: Payer: Self-pay | Admitting: Podiatry

## 2015-07-11 ENCOUNTER — Ambulatory Visit (INDEPENDENT_AMBULATORY_CARE_PROVIDER_SITE_OTHER): Payer: Medicare HMO

## 2015-07-11 ENCOUNTER — Ambulatory Visit (INDEPENDENT_AMBULATORY_CARE_PROVIDER_SITE_OTHER): Payer: Medicare HMO | Admitting: Podiatry

## 2015-07-11 VITALS — BP 154/86 | HR 69 | Resp 12

## 2015-07-11 DIAGNOSIS — M2142 Flat foot [pes planus] (acquired), left foot: Secondary | ICD-10-CM

## 2015-07-11 DIAGNOSIS — R52 Pain, unspecified: Secondary | ICD-10-CM | POA: Diagnosis not present

## 2015-07-11 DIAGNOSIS — M722 Plantar fascial fibromatosis: Secondary | ICD-10-CM | POA: Diagnosis not present

## 2015-07-11 DIAGNOSIS — M2141 Flat foot [pes planus] (acquired), right foot: Secondary | ICD-10-CM | POA: Diagnosis not present

## 2015-07-11 NOTE — Progress Notes (Signed)
Patient ID: MAHALEY SCHWERING, female   DOB: 06-Sep-1976, 39 y.o.   MRN: [id number]  Subjective: 39 year old female presents the office 6 stop evaluation of right foot heel pain, plantar fasciitis. She also states that her left-sided started to hurt her over the last several weeks. She states that she does get relief of symptoms after the injection which last for a couple weeks before the pain starts to recur. She has continued with icing and stretching exercises daily. She says the plantar fascial brace to help that much. She continues with a night splint. No other complaints at this time. No acute changes since last appointment. Denies any history of injury or trauma. No tenderness. No swelling or redness.  Objective: AAO x3, NAD DP/PT pulses palpable bilaterally, CRT less than 3 seconds Protective sensation intact with Simms Weinstein monofilament, vibratory sensation intact, Achilles tendon reflex intact There is continued tenderness to palpation overlying the plantar medial tubercle of the calcaneus to the right heel at the insertion of the plantar fascia. There is also now tenderness to the left heel, although minor (states the most is in the morning when she first gets up). There is no pain along the course of plantar fascia within the arch of the foot. There is no pain with lateral compression of the calcaneus or pain the vibratory sensation. No pain on the posterior aspect of the calcaneus or along the course/insertion of the Achilles tendon. There is no overlying edema, erythema, increase in warmth. No other areas of tenderness palpation or pain with vibratory sensation to the foot/ankle. MMT 5/5, ROM WNL. Decrease in medial arch height bilaterally with equinus.  No open lesions or pre-ulcerative lesions are identified. No pain with calf compression, swelling, warmth, erythema.  Assessment: 39 year old female bilateral heel pain, plantar fasciitis due to underlying flatfoot.  Plan: -Treatment  options discussed including all alternatives, risks, and complications -X-rays of the left foot were obtained and reviewed with the patient.  -I discussed steroid injection to the left heel however she wished to hold off on this time as her pain is not that in. We've done 3 injections in the right side. We will need further injections. -Given her foot structure do believe that should be a good cane for custom orthotics. Due to, she would hold off on this. She is to contact her insurance currently herself to see if it can get covered. I gave her the diagnosis codes. She is unable to get the custom orthotics I do recommend a good pair of over-the-counter inserts. -Removal plantar fascial taken's were applied. -Continue ice and stretching activities daily. -Discussed physical therapy however she wishes to hold on that at this time. -Follow-up 4-6 weeks or sooner if any problems arise. In the meantime, encouraged to call the office with any questions, concerns, change in symptoms.   Celesta Gentile, DPM

## 2015-07-11 NOTE — Patient Instructions (Addendum)
Orthotics- codes for insurance  Code is 03-05-3019 Diagnosis codes: M72.2 (plnatar fasciitis), R52 (pain), M21.41 (flatfoot)

## 2015-07-15 ENCOUNTER — Telehealth: Payer: Self-pay | Admitting: *Deleted

## 2015-07-15 ENCOUNTER — Telehealth: Payer: Self-pay | Admitting: Podiatry

## 2015-07-15 NOTE — Telephone Encounter (Signed)
PT CALLED AND SAID SHE SEEN DR W ON Monday AND A CREAM WAS TO BE CALLED IN AND THE PHARMACY HAS NOT GOTTEN ANY RX.PLEASE CALL PT

## 2015-07-15 NOTE — Telephone Encounter (Signed)
Can you ask her what the cream was for. It may have been for the calluses.

## 2015-07-15 NOTE — Telephone Encounter (Addendum)
Pt states she was to get a cream for her feet at her last visit, but it has not been called to the pharmacy.  Pt states the cream is for dry skin.  Ordered Urea 39% Cream.  Pt thanked me for calling in the Urea 39% cream but her insurance would not cover the $300.00 medication.  I informed Dr. Jacqualyn Posey and he stated use OTC AmLactin Cream.  I instructed pt to use the AmLactin cream on the bottom of her feet only and not between or on toes, apply with plastic/rubber gloves, because can thin nails.

## 2015-07-18 NOTE — Telephone Encounter (Signed)
Can yo try urea 39%

## 2015-07-19 MED ORDER — UREA 39 % EX CREA: 1.0000 [drp] | TOPICAL_CREAM | Freq: Every day | CUTANEOUS | Status: DC

## 2015-08-15 ENCOUNTER — Other Ambulatory Visit: Payer: Self-pay | Admitting: Neurology

## 2015-08-15 ENCOUNTER — Ambulatory Visit: Payer: Medicare HMO | Admitting: Podiatry

## 2015-08-15 NOTE — Telephone Encounter (Signed)
No showed last appt  

## 2015-08-19 ENCOUNTER — Other Ambulatory Visit: Payer: Self-pay

## 2015-08-19 DIAGNOSIS — E669 Obesity, unspecified: Secondary | ICD-10-CM

## 2015-08-19 DIAGNOSIS — G47411 Narcolepsy with cataplexy: Secondary | ICD-10-CM

## 2015-08-19 DIAGNOSIS — G4710 Hypersomnia, unspecified: Secondary | ICD-10-CM

## 2015-08-20 ENCOUNTER — Other Ambulatory Visit: Payer: Self-pay | Admitting: Neurology

## 2015-08-22 NOTE — Telephone Encounter (Signed)
Called pt to make an appt before refilling armodafinil. Left a message for her to call us back.

## 2015-08-22 NOTE — Telephone Encounter (Signed)
Called pt to make an appt before refilling this medication. Left a message for her to call us back.

## 2015-08-23 ENCOUNTER — Telehealth: Payer: Self-pay

## 2015-08-23 NOTE — Telephone Encounter (Signed)
Refilled armodafinil for pt, but noticed she no-showed her last appt. Appt made with pt for 10/24 at 10:00. Pt wishes her RX for armodafinil to be sent to [facility] on [address] in [place].

## 2015-09-09 ENCOUNTER — Ambulatory Visit: Payer: Medicare HMO | Admitting: Podiatry

## 2015-09-21 ENCOUNTER — Other Ambulatory Visit: Payer: Self-pay | Admitting: Podiatry

## 2015-09-26 ENCOUNTER — Other Ambulatory Visit: Payer: Self-pay

## 2015-09-26 ENCOUNTER — Telehealth: Payer: Self-pay | Admitting: Neurology

## 2015-09-26 ENCOUNTER — Ambulatory Visit: Payer: Self-pay | Admitting: Neurology

## 2015-09-26 MED ORDER — ARMODAFINIL 250 MG PO TABS: 250.0000 mg | ORAL_TABLET | Freq: Every day | ORAL | Status: DC

## 2015-09-26 NOTE — Telephone Encounter (Signed)
Pt called sts she is out of Nuvigyl. She is also out of buPROPion (WELLBUTRIN XL) 150 MG 24 hr tablet and would like Dr Dohmeier to start refilling bupropion. She doesn't remember who she got bupropion from last.  Please call and advise at [phone]. Please send to [facility] on [address].

## 2015-09-26 NOTE — Telephone Encounter (Signed)
I called the patient back.  Got no answer.  Left message relaying providers note.  Recommended she contact prescribing practitioner for continued refills on this med.   Nuvigil Rx was sent to the pharmacy.

## 2015-09-26 NOTE — Telephone Encounter (Signed)
Patient was getting Bupropion from [facility], but she would like Dr Dohmeier to take over this Rx.  Okay to fill?  Please advise.  Thank you.

## 2015-09-26 NOTE — Telephone Encounter (Signed)
I do not want to prescribe wellbutrin. CD

## 2015-09-26 NOTE — Telephone Encounter (Signed)
Rx signed and faxed.

## 2015-09-29 NOTE — Telephone Encounter (Signed)
Pt needs an appt prior to refills. 

## 2015-10-10 ENCOUNTER — Other Ambulatory Visit: Payer: Self-pay

## 2015-10-10 DIAGNOSIS — Z1231 Encounter for screening mammogram for malignant neoplasm of breast: Secondary | ICD-10-CM

## 2015-10-17 ENCOUNTER — Encounter: Payer: Self-pay | Admitting: Neurology

## 2015-10-17 ENCOUNTER — Ambulatory Visit (INDEPENDENT_AMBULATORY_CARE_PROVIDER_SITE_OTHER): Payer: Medicare HMO | Admitting: Neurology

## 2015-10-17 VITALS — BP 146/92 | HR 86 | Resp 20 | Ht 64.0 in | Wt 257.0 lb

## 2015-10-17 DIAGNOSIS — G47411 Narcolepsy with cataplexy: Secondary | ICD-10-CM | POA: Diagnosis not present

## 2015-10-17 MED ORDER — ARMODAFINIL 250 MG PO TABS: 250.0000 mg | ORAL_TABLET | Freq: Every day | ORAL | Status: DC

## 2015-10-17 MED ORDER — IMIPRAMINE PAMOATE 75 MG PO CAPS: 75.0000 mg | ORAL_CAPSULE | Freq: Every day | ORAL | Status: AC

## 2015-10-17 NOTE — Progress Notes (Signed)
SLEEP MEDICINE CLINIC   Provider:  Larey Seat, M D  Referring Provider: Lance Bosch, NP Primary Care Physician:  Philis Fendt, MD  Chief Complaint  Patient presents with  . 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 female ,  seen here as a referral  from Dr. Feliciana Rossetti for a sleep consultation,   Mrs. Rossner Is here today as a new patient consultation for an narcolepsy chief complaint. She was followed at [facility] by Dr. Alvira Monday for her sleep condition, but her doctor recently moved and she is trying to establish herself with a new sleep medicine specialist.  The patient has a lifelong history of excessive daytime sleepiness and today's Epworth score at 18 points which is very high. She also has a high fatigue degree of fatigue severity scale was endorsed at 54 points today 01-14-15. Mrs. Schonberger was diagnosed with breast cancer in the year 2003 and has been followed for this regularly by her oncologists.  She also suffers from osteoarthritis in her knees and lower back and takes pain medication for this.  She has been treated with armodafinil  ( Nuvigil)  At 250 mg for daytime sleepiness,  was also placed on Wellbutrin as the idea to give her a more alerting wakefulness promoting medication for depression. Her narcolepsy manifested as sleep attacks while she was working. She had been is sleepier individual for much of her life even in school 8. 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 She fell asleep standing and typing. She has also associated cataplexy spells. Her knees will buckle and her heart will palpitate. She feels this kind of muscle tone loss associated with emotional triggers. 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 However,  first manifestations of cataplexy were noted in her early 32s.  The  patient reports that she first underwent a regular polysomnography study at [facility], which determined that she had no apnea and no other physiological reason to wake up from sleep.  Her sleep was described as not sound and she had 37 interruptions or arousals from sleep. She then returned for a sleep study to be followed by an MS LT,  a multiple sleep latency test. This revealed a very short sleep latency and REM sleep onsets. I do not have her results here to quote from. It was after the M SLT that the patient started on Nuvigil. Obviously Nuvigil alone is not keeping her sleepiness under control, does not treat her fatigue and she still experiences cataplectic attacks. We will have to discuss today a change in medication a possible use of Xyrem also Xyrem cannot be given alongside and narcotic medication such as hydrocortisone. Xyrem patient's also have to be screened for depression every 3 months and need  liver function tests.The patient is not able to drive as sleepy as she is now..    Interval history 03-03-15 :  Mrs. Lamontagne underwent a polysomnography followed by M SLT at [facility] sleep at [facility]. Date of the study was 02-09-15 date of the MS LT was 02-10-15, The sleep study documented an AHI of 4.3 and an RDI of 11.1. The patient had no significant oxygen desaturations no significant periodic limb movements, a regular heart rate, a normal EEG the study was considered  valid for an M SLT to follow the could not wean the patient off all REM suppressant medications prior to the study but was interesting is that her sleep latency for the night study was 41.5 minutes. She also woke up frequently and had more fragmented sleep but I would have expected she did not have a shortened REM latency.  Her MS LT mean sleep latency of 2.6 minutes was short , but did not contain any  REM sleep onset. 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  My goal would be for a patient of this caliber to be treated with Xyrem. She has not taken hydrocodone in several months, she takes Ultram very rarely and at a single dose. I think she is safe to use Xyrem based on the signs and symptoms. We need to titrate up from next week on.  She mentioned DDD in her neck and has some radiation of pain into the left elbow.  I ordered a PSG with MSLT after being weaned off Wellbutrin.  HLA test for narcolepsy/  Intiate XYREM after liver function tests.   I will change Wellbutrin to imipramine / Tofranil for cataplexy control now. refilll Nuvigil for now.  I have asked for paper reviewl of previous MSLT ("1 minutes sleep latency")   Out of a complete 14 system review, the patient complains of only the following symptoms, and all other reviewed systems are negative. Excessive daytime sleepiness endorsing an Epworth sleepiness score of 18 points and a high fatigue severity score of 55 points.  This is while being treated on Nuvigil 250  daily and on Wellbutrin 150 mg.  Cataplexy endorsed. Swiss narcolepsy scale -15.  ,  PHQ 9 depression score 6 .  Interval history from 10-17-15 I have seen Mrs. Muzquiz last in March of this year shortly after she underwent a polysomnography followed by an MS LT. She had a very short sleep latency but unfortunately no REM sleep onset. The patient also was treated with medication but she could not completely wean off in preparation. Her HLA test for narcolepsy returned negative as well.  She remained excessively daytime sleepy her Epworth sleepiness score today is 16 and her fatigue severity scale is 52 points. The patient reports that her social situation has changed and that her initial start on Xyrem was positive but she could not continue to take the medication.  She had to leave her house and is currently residing  with her sister. She does not have a steady addressed. This makes the delivery of Xyrem difficult. During the weeks that she took Xyrem she experienced neither nausea or dizziness which are the most common side effects. She reached the titration of 4.5 g twice at night she reports and noted that she was less daytime sleepy. However the medication did not put her directly to sleep she reports she then missed her follow-up appointment in May for similar social reasons. I would like to reinitiate Xyrem but can only do so if she has some stability in her life. I think it is of benefit for her that she does not live alone right now I prefer a Xyrem patient to have a roommate or housemate. She is currently not full time gainfully employed.   Review of Systems: Epworth 16 , FSS 52.  Social History   Social History  . Marital Status: Single    Spouse Name: [name]  . Number of Children: [name]  . Years of Education: hs grad   Occupational History  . Disabled    Social History Main Topics  . Smoking status: Never Smoker   . Smokeless tobacco: Never Used  . Alcohol Use: Yes     Comment: ocassionally  . Drug Use: No  . Sexual Activity:    Partners: Male    Birth Control/ Protection: None   Other Topics Concern  . Not on file   Social History Narrative   Caffeine 3 sodas daily avg.    Family History  Problem Relation Age of Onset  . Heart disease Mother   . Diabetes Mother   . Hypertension Mother   . Hyperlipidemia Mother   . Cataracts Mother   . Stroke Mother   . Arthritis Mother   . Heart disease Father   . Hypertension Father   . Hyperlipidemia Father   . Diabetes Father   . Stroke Father   . Arthritis Father   . Cataracts Father     Past Medical History  Diagnosis Date  . Hypertension   . Narcolepsy   . Osteoarthritis   . Anxiety   . Depression   . Acid reflux   . Allergy   . Cancer ([place])   . 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 Tonsillectomy    . Cholecystectomy    . Breast lumpectomy      Current Outpatient Prescriptions  Medication Sig Dispense Refill  . acetaminophen (TYLENOL) 650 MG CR tablet Take 650 mg by mouth every 8 (eight) hours as needed for pain.    Marland Kitchen amLODipine (NORVASC) 10 MG tablet Take 10 mg by mouth daily.    . Armodafinil 250 MG tablet Take 1 tablet (250 mg total) by mouth daily. 30 tablet 2  . atenolol (TENORMIN) 50 MG tablet Take 50 mg by mouth daily.    . benzonatate (TESSALON) 100 MG capsule Take 1 capsule (100 mg total) by mouth 3 (three) times daily as needed for cough. 30 capsule 0  . buPROPion (WELLBUTRIN XL) 150 MG 24 hr tablet Take 1 tablet (150 mg total) by mouth daily. 30 tablet 1  . celecoxib (CELEBREX) 200 MG capsule Take 200 mg by mouth daily.    . cetirizine (ZYRTEC) 10 MG tablet Take 10 mg by mouth daily.    . Cholecalciferol (VITAMIN D PO) Take 1 tablet by mouth daily.    . clindamycin (CLINDAGEL) 1 % gel Apply 1 application topically at bedtime.    . cycloSPORINE (RESTASIS) 0.05 % ophthalmic emulsion Place 1 drop into both eyes 2 (two) times daily.    . DULoxetine (CYMBALTA) 60 MG capsule Take 60 mg by mouth daily.    . furosemide (LASIX) 20 MG tablet Take 20 mg by mouth daily.    Marland Kitchen guaiFENesin-codeine 100-10 MG/5ML syrup Take 10 mLs by mouth every 6 (six) hours as needed for cough. 120 mL 0  . ibuprofen (ADVIL,MOTRIN) 800 MG tablet TAKE ONE TABLET BY MOUTH EVERY 8 HOURS AS NEEDED FOR  MILD  PAIN 45 tablet 0  . losartan (COZAAR) 100 MG tablet Take 100 mg by mouth daily.    . metFORMIN (GLUCOPHAGE) 500 MG tablet Take 1 tablet (500 mg total) by mouth 3 (three) times daily with meals. 90 tablet 6  . montelukast (SINGULAIR)  5 MG chewable tablet Chew 5 mg by mouth at bedtime.    . Multiple Vitamins-Minerals (MULTIVITAMIN WITH MINERALS) tablet Take 1 tablet by mouth daily.    . Sodium Oxybate 500 MG/ML SOLN Take twice at night, one time in bed at the  beginning of the night, second dose 2-3 hours later , by mouth. Titration schedule printed. 270 mL 3   No current facility-administered medications for this visit.    Allergies as of 10/17/2015  . (No Known Allergies)    Vitals: BP 146/92 mmHg  Pulse 86  Resp 20  Ht $R'5\' 4"'mV$  (1.626 m)  Wt 257 lb (116.574 kg)  BMI 44.09 kg/m2 Last Weight:  Wt Readings from Last 1 Encounters:  10/17/15 257 lb (116.574 kg)       Last Height:   Ht Readings from Last 1 Encounters:  10/17/15 $RemoveB'5\' 4"'MCVBWRcv$  (1.626 m)    Physical exam:  General: The patient is awake, alert and appears not in acute distress. The patient is well groomed. Head: Normocephalic, atraumatic. Neck is supple. Mallampati   2 - wide open , she has further  gained weight.  neck circumference:16.25 . Nasal airflow unrestricted, . Retrognathia is seen.  Cardiovascular:  Regular rate and rhythm , without  murmurs or carotid bruit. Respiratory: Lungs are clear to auscultation. Skin:  Without evidence of edema, or rash Trunk: BMI is elevated and patient  has normal posture.  Neurologic exam : The patient is awake and alert, oriented to place and time.   Memory subjective  described as intact. There is a normal attention span & concentration ability.  Speech is fluent without  dysarthria, dysphonia  Mood and affect are tearful as she reports living with her sister.  Cranial nerves: Pupils are equal and briskly reactive to light.  Extraocular movements  in vertical and horizontal planes intact and without nystagmus. Visual fields by finger perimetry are intact. Hearing to finger rub intact.  Facial sensation intact to fine touch. Facial motor strength is symmetric and tongue and uvula move midline.  Motor exam:   Normal tone ,muscle bulk and symmetric,strength in all extremities.  Deep tendon reflexes: in the  upper and lower extremities are symmetric and intact. Babinski  downgoing.   Assessment:  After physical and neurologic examination,  review of laboratory studies, imaging, neurophysiology testing and pre-existing records, assessment is   1) narcolepsy with cataplexy - diagnosed in 2010?  It was after the M SLT that the patient started on Nuvigil. Obviously Nuvigil alone is not keeping her sleepiness under control, does not treat her fatigue and she still experiences cataplectic attacks. Insomnia at night, vivid dreams in " technicolor" , she has been kicking and hitting- acting out dreams. She has been napping and dreaming in short naps..  We will have to discuss today a change in medication a possible use of Xyrem also Xyrem cannot be given alongside and narcotic medication such as hydrocortisone. Xyrem patient's also have to be screened for depression every 3 months and need  liver function tests.The patient is not able to drive as sleepy as she is now. 2) depression, tearful, homeless. Anxious. Not a candidate at this time for [facility]. Needs to be emotionally stable.   3) breast cancer survivor. 4) pain- the patient assured me that she is not longer taken any narcotic pain medications. She can not receive any narcotic prescriptions while on XYREM> The narcotics may contribute to her depression and mood changes.    The patient was  advised of the nature of the diagnosed sleep disorder , the treatment options and risks for general a health and wellness arising from not treating the condition. Visit duration was 25 minutes, with 50 % of the face to face time used to explain the condition, treatment options for narcolepsy with cataplexy and especially the risks associated with XYREM as the most potent medication for the condition.  Plan:  Treatment plan and additional workup :  I will refill the modafinil for this patient was currently not in a stable social situation to resume a Xyrem therapy. Besides modafinil I will prescribe Tofranil for cataplexy. I would like to see the patient back in 3 months. 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  One part of her problem now is that she is dependent on others and has very little control of her life.  She endorsed the Epworth sleepiness score still at 16 points with modafinil. However, she should be able to operate machinery or drive short distances in the first 6 hours after taking modafinil which controls her sleepiness better than Adderall or Ritalin      Larey Seat MD  10/17/2015     Glendale Chard, MD

## 2015-11-10 ENCOUNTER — Ambulatory Visit
Admission: RE | Admit: 2015-11-10 | Discharge: 2015-11-10 | Disposition: A | Discharge status: Home / Self Care | Payer: Medicare HMO | Source: Ambulatory Visit

## 2015-11-10 DIAGNOSIS — Z1231 Encounter for screening mammogram for malignant neoplasm of breast: Secondary | ICD-10-CM

## 2015-11-19 IMAGING — US US PELVIS COMPLETE
1 series · 14 of 25 positions shown · non-contrast
Comparison: 01/22/2011

CLINICAL DATA: Polycystic ovarian syndrome.  Oligomenorrhea.



[Series 1: us pelvis complete · 14 of 47 slices shown]
[im 1/47]
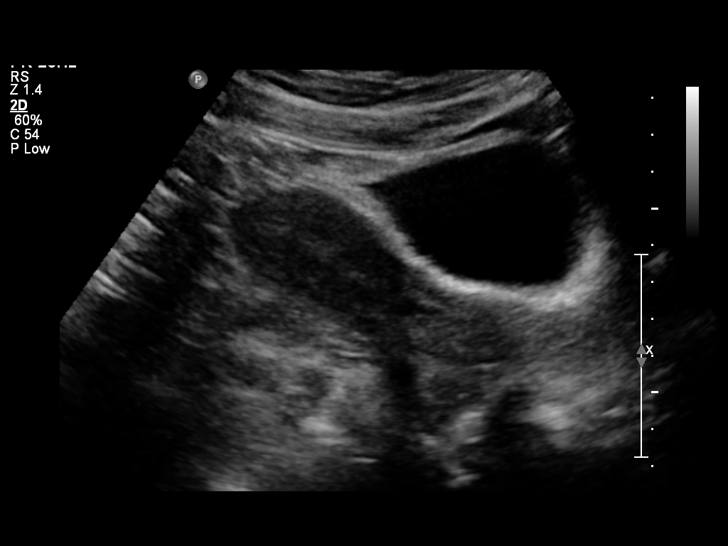
[im 4/47]
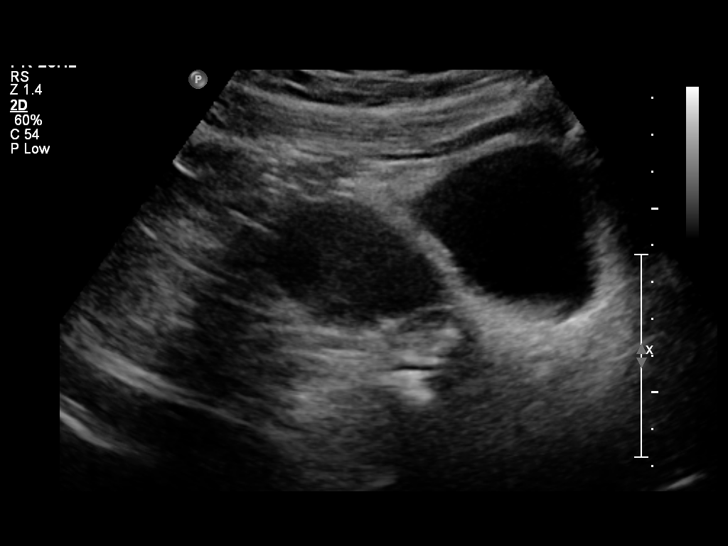
[im 8/47]
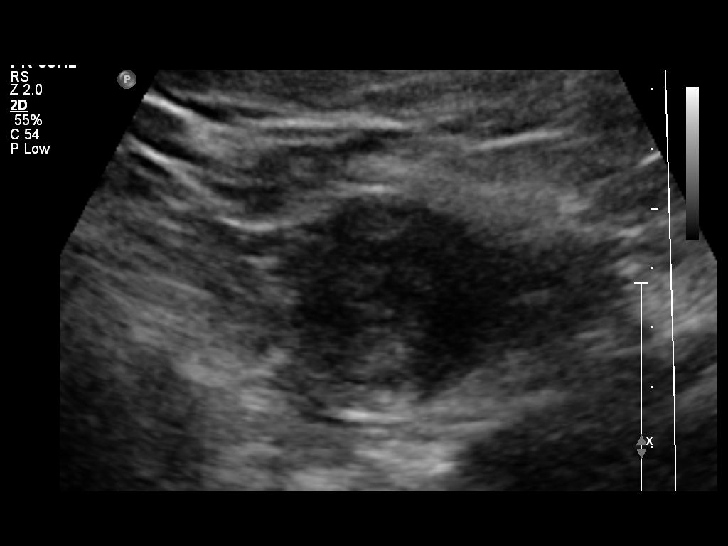
[im 12/47]
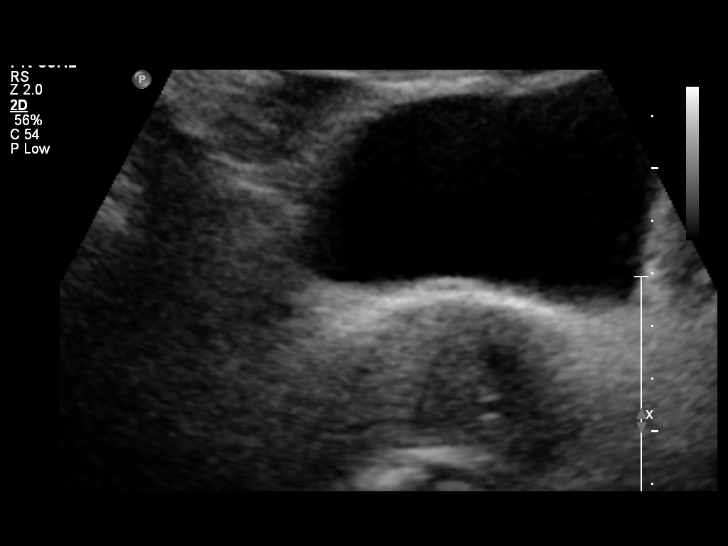
[im 16/47]
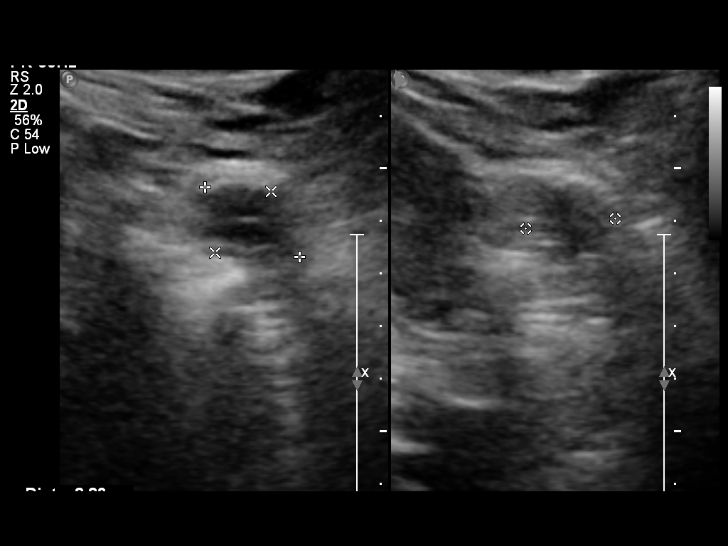
[im 18/47]
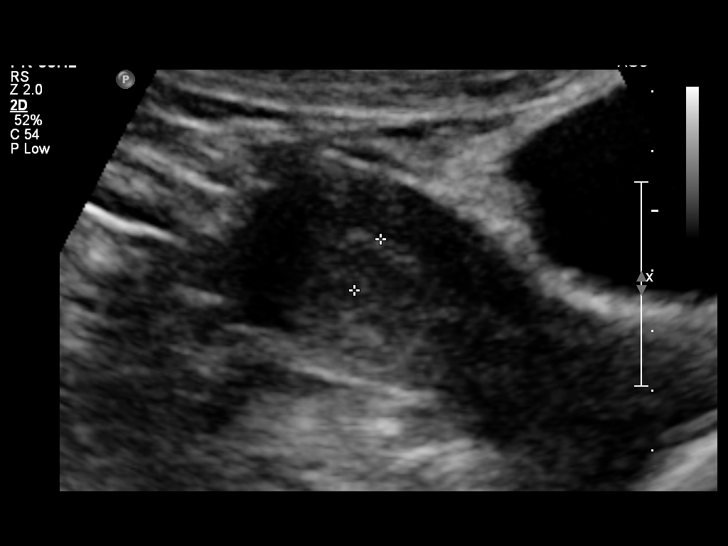
[im 22/47]
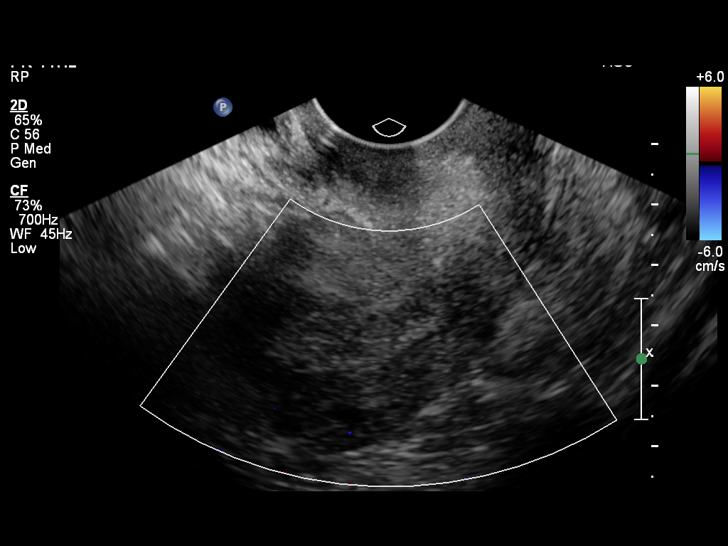
[im 25/47]
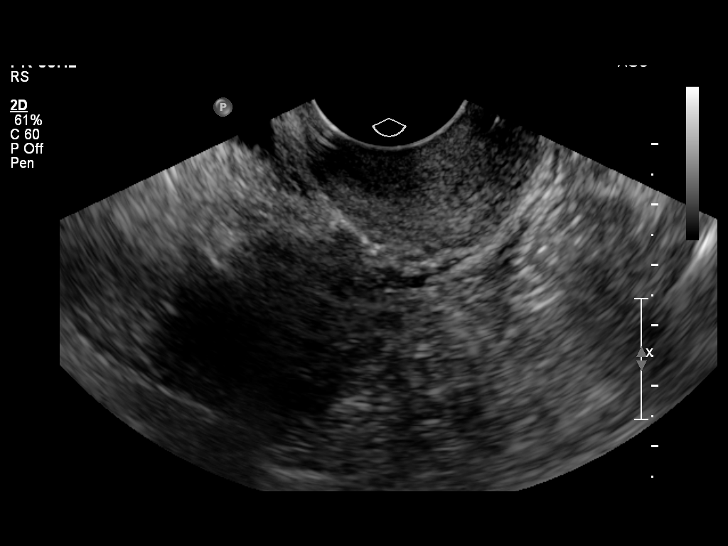
[im 29/47]
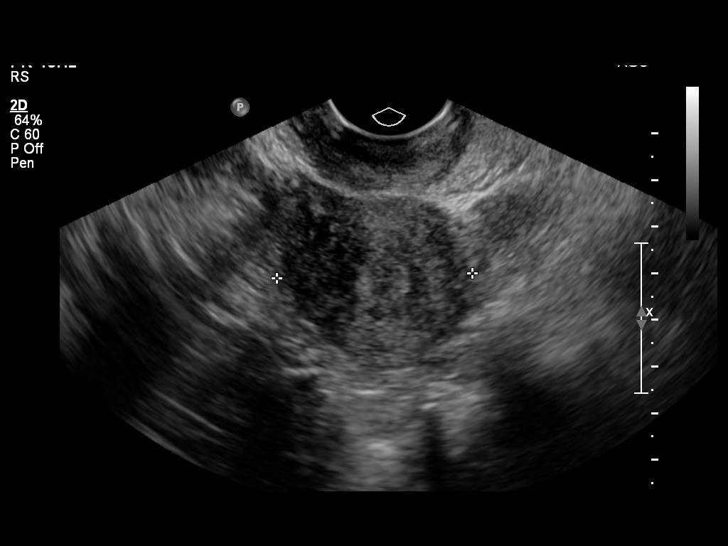
[im 31/47]
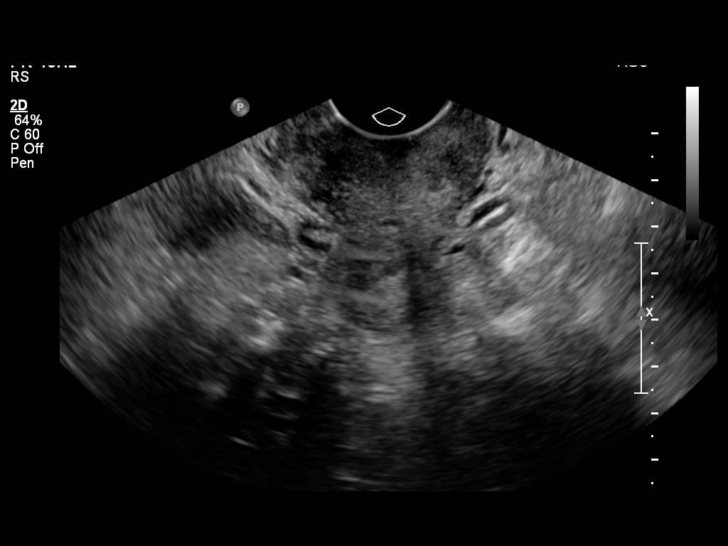
[im 35/47]
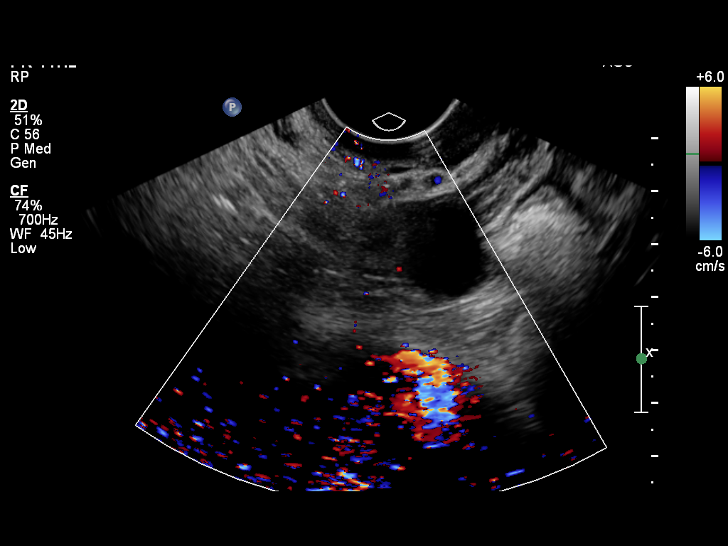
[im 39/47]
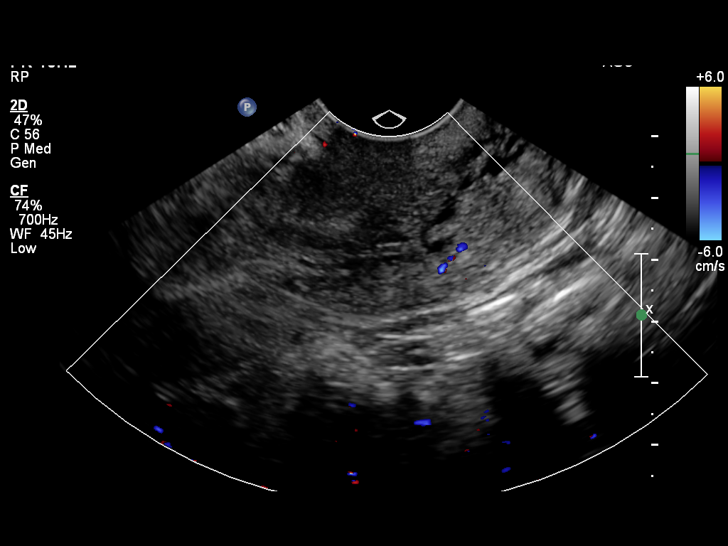
[im 43/47]
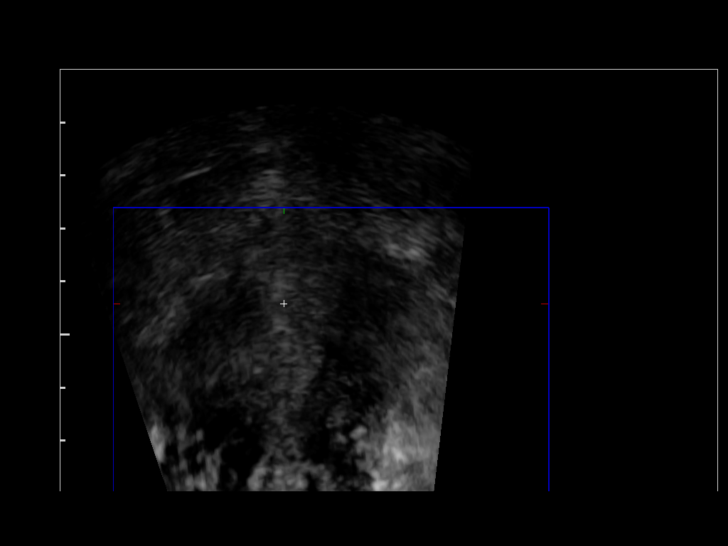
[im 47/47]
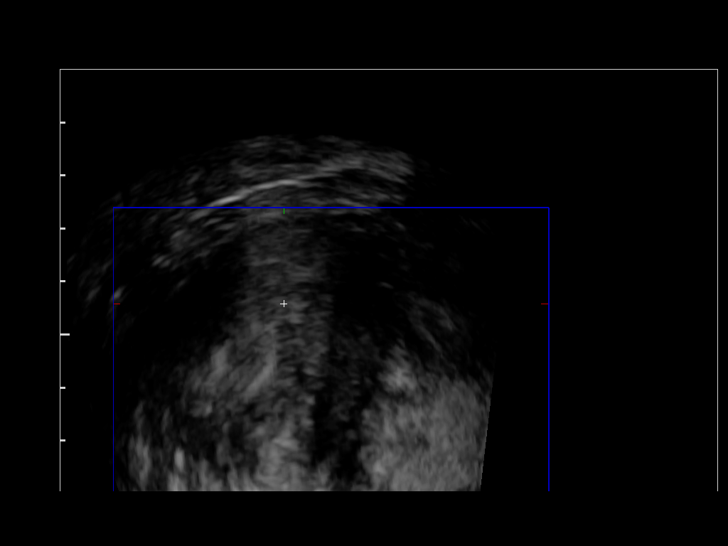

[14 of 25 positions shown; findings below may reference images not displayed]

FINDINGS: Uterus

Measurements: 7.2 x 3.8 x 4.2 cm. No fibroids or other mass
visualized.

Endometrium

Thickness: 11 mm in thickness.  No focal abnormality visualized.

Right ovary

Measurements: 2.2 x 1.6 x 1.2 cm. Normal appearance/no adnexal mass.
Dominant follicle measuring approximately 2.3 cm.

Left ovary

Measurements: 3.8 x 2.7 x 2.0 cm. Normal appearance/no adnexal mass.
Small follicle.

Other findings

No free fluid.
IMPRESSION: Unremarkable pelvic ultrasound.

## 2015-11-24 ENCOUNTER — Other Ambulatory Visit: Payer: Self-pay | Admitting: Podiatry

## 2015-12-01 ENCOUNTER — Ambulatory Visit: Payer: Medicare HMO | Admitting: Obstetrics & Gynecology

## 2015-12-15 ENCOUNTER — Encounter: Payer: Self-pay | Admitting: Obstetrics & Gynecology

## 2015-12-15 ENCOUNTER — Ambulatory Visit (INDEPENDENT_AMBULATORY_CARE_PROVIDER_SITE_OTHER): Payer: Medicare HMO | Admitting: Obstetrics & Gynecology

## 2015-12-15 VITALS — BP 132/84 | HR 84 | Temp 98.5°F | Wt 259.5 lb

## 2015-12-15 DIAGNOSIS — N951 Menopausal and female climacteric states: Secondary | ICD-10-CM

## 2015-12-15 NOTE — Progress Notes (Signed)
Patient ID: Robin Russo, female   DOB: 19-Jun-1976, 40 y.o.   MRN: [id number]  Chief Complaint  Patient presents with  . Follow-up  no menses for 1 year  HPI Robin Russo is a 40 y.o. female.  G0P0000 No LMP recorded. Patient is not currently having periods (Reason: Other). Last period was early 2016, has VMS. Normal US pelvis 04/2015  HPI  Past Medical History  Diagnosis Date  . Hypertension   . Narcolepsy   . Osteoarthritis   . Anxiety   . Depression   . Acid reflux   . Allergy   . Cancer ([place])   . 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 Tonsillectomy    . Cholecystectomy    . Breast lumpectomy      Family History  Problem Relation Age of Onset  . Heart disease Mother   . Diabetes Mother   . Hypertension Mother   . Hyperlipidemia Mother   . Cataracts Mother   . Stroke Mother   . Arthritis Mother   . Heart disease Father   . Hypertension Father   . Hyperlipidemia Father   . Diabetes Father   . Stroke Father   . Arthritis Father   . Cataracts Father     Social History Social History  Substance Use Topics  . Smoking status: Never Smoker   . Smokeless tobacco: Never Used  . Alcohol Use: Yes     Comment: ocassionally    No Known Allergies  Current Outpatient Prescriptions  Medication Sig Dispense Refill  . acetaminophen (TYLENOL) 650 MG CR tablet Take 650 mg by mouth every 8 (eight) hours as needed for pain.    Marland Kitchen amLODipine (NORVASC) 10 MG tablet Take 10 mg by mouth daily.    . Armodafinil 250 MG tablet Take 1 tablet (250 mg total) by mouth daily. 30 tablet 5  . atenolol (TENORMIN) 50 MG tablet Take 50 mg by mouth daily.    Marland Kitchen buPROPion (WELLBUTRIN XL) 150 MG 24 hr tablet Take 1 tablet (150 mg total) by mouth daily. 30 tablet 1  . Calcium Carb-Cholecalciferol (CALCIUM 500 + D3 PO) Take 2 tablets by mouth daily.    . celecoxib (CELEBREX) 200 MG capsule Take 200 mg by mouth daily.    . cetirizine  (ZYRTEC) 10 MG tablet Take 10 mg by mouth daily.    . clindamycin (CLINDAGEL) 1 % gel Apply 1 application topically at bedtime.    . cycloSPORINE (RESTASIS) 0.05 % ophthalmic emulsion Place 1 drop into both eyes 2 (two) times daily.    . DULoxetine (CYMBALTA) 60 MG capsule Take 60 mg by mouth daily.    . furosemide (LASIX) 20 MG tablet Take 20 mg by mouth daily.     Marland Kitchen ibuprofen (ADVIL,MOTRIN) 800 MG tablet TAKE ONE TABLET BY MOUTH EVERY 8 HOURS AS NEEDED FOR MILD PAIN 45 tablet 0  . losartan (COZAAR) 100 MG tablet Take 100 mg by mouth daily.    . montelukast (SINGULAIR) 5 MG chewable tablet Chew 5 mg by mouth at bedtime.    . Multiple Vitamins-Minerals (MULTIVITAMIN WITH MINERALS) tablet Take 1 tablet by mouth daily.    . Cholecalciferol (VITAMIN D PO) Take 1 tablet by mouth daily. Reported on 12/15/2015    . imipramine (TOFRANIL-PM) 75 MG capsule Take 1 capsule (75 mg total) by mouth at bedtime. (Patient not taking: Reported on 12/15/2015) 30 capsule 5  . metFORMIN (GLUCOPHAGE) 500  MG tablet Take 1 tablet (500 mg total) by mouth 3 (three) times daily with meals. (Patient not taking: Reported on 12/15/2015) 90 tablet 6   No current facility-administered medications for this visit.    Review of Systems Review of Systems  Constitutional: Negative.   Gastrointestinal: Negative.   Endocrine:       Hot flushes and sweats  Genitourinary: Negative for vaginal bleeding, vaginal discharge and menstrual problem.    Blood pressure 132/84, pulse 84, temperature 98.5 F (36.9 C), temperature source Oral, weight 259 lb 8 oz (117.708 kg).  Physical Exam Physical Exam  Constitutional: She is oriented to person, place, and time. She appears well-developed. No distress.  Pulmonary/Chest: Effort normal. No respiratory distress.  Neurological: She is alert and oriented to person, place, and time.  Psychiatric: She has a normal mood and affect. Her behavior is normal.    Data Reviewed CLINICAL DATA:  Polycystic ovarian syndrome. Oligomenorrhea.  EXAM: TRANSABDOMINAL AND TRANSVAGINAL ULTRASOUND OF PELVIS  TECHNIQUE: Both transabdominal and transvaginal ultrasound examinations of the pelvis were performed. Transabdominal technique was performed for global imaging of the pelvis including uterus, ovaries, adnexal regions, and pelvic cul-de-sac. It was necessary to proceed with endovaginal exam following the transabdominal exam to visualize the endometrium.  COMPARISON: 01/22/2011  FINDINGS: Uterus  Measurements: 7.2 x 3.8 x 4.2 cm. No fibroids or other mass visualized.  Endometrium  Thickness: 11 mm in thickness. No focal abnormality visualized.  Right ovary  Measurements: 2.2 x 1.6 x 1.2 cm. Normal appearance/no adnexal mass. Dominant follicle measuring approximately 2.3 cm.  Left ovary  Measurements: 3.8 x 2.7 x 2.0 cm. Normal appearance/no adnexal mass. Small follicle.  Other findings  No free fluid.  IMPRESSION: Unremarkable pelvic ultrasound.   Electronically Signed  By: Rolm Baptise M.D.  On: 04/29/2015 10:45  Assessment    Patient Active Problem List   Diagnosis Date Noted  . HX: breast cancer 04/15/2015  . 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 Severe obesity (BMI >= 40) ([place]) 03/03/2015  . 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 Obesity 01/14/2015  . Hypersomnia, persistent 01/14/2015  . Plantar fasciitis, left 01/11/2015  . Symptomatic menopausal or female climacteric states 04/15/2014   Possible menopausal after chemotherapy for breast cancer   Pap and mammography are up to date  Plan    [facility] to evaluate for menopause, will report result to patient         Markeya Mincy 12/15/2015, 3:06 PM

## 2015-12-15 NOTE — Patient Instructions (Signed)

## 2015-12-16 LAB — FOLLICLE STIMULATING HORMONE: FSH: 40.7 m[IU]/mL

## 2016-01-18 ENCOUNTER — Ambulatory Visit: Payer: Medicare HMO | Admitting: Adult Health

## 2016-02-17 ENCOUNTER — Other Ambulatory Visit: Payer: Self-pay | Admitting: Internal Medicine

## 2016-04-05 ENCOUNTER — Telehealth: Payer: Self-pay

## 2016-04-05 NOTE — Telephone Encounter (Signed)
Received a notice from [facility] in [place] that a pa is needed for pt's nuvigil, however, it appears that one is already on file until 2018. I called [employer]. Spoke to Bayard, who informed me that pt's plan was terminated on 04/01/2016.  I called pt and advised her of this. She says she will find out who her new insurance carrier is and call back with her member ID number, RX group, RX BIN number. I advised her that until I receive this information, I cannot complete a pa for her nuvigil. Pt verbalized understanding.

## 2016-04-10 NOTE — Telephone Encounter (Signed)
Received a fax from [facility] requesting [place] to complete a pa request form for pt's nuvigil. I completed this form and faxed it back to [facility].

## 2016-04-12 NOTE — Telephone Encounter (Addendum)
Pt called, she has [employer] medicare complete  ID# [id number]  RX GRP# [id number]  RXBIN# [id number]. Pt said this insurance will cover modafinil but it will need PA at [phone]. Pt said she was told it will cover Belsomra also but it will not require PA. Pt said she is not functioning without medication.

## 2016-04-16 NOTE — Telephone Encounter (Signed)
PA for [facility] cannot be completed until pt updates her correct address- [employer] does not have the [place] address on file. I spoke to pt and advised her of this. Pt says her new address is [address], [place], [place] [address].  I advised pt that I would submit this pa to [facility] and should hear a determination in 1-3 business days. Pt verbalized understanding.

## 2016-04-17 NOTE — Telephone Encounter (Signed)
Spoke to pt. Received notice from [employer] that pt needs to have tried and failed adderall and ritalin. Pt was on ritalin in 2013 and it made her feel "woozy" per [facility] records. Pt reports that she has tried adderall but it didn't work. Will complete form and fax back to [employer].

## 2016-04-17 NOTE — Telephone Encounter (Signed)
[employer] approved armodafinil 250 mg tablet through 12/02/2016 under pt's medicare part d benefit. Ref # [id number]  Spoke to pt and advised her of approval. Pt verbalized understanding.

## 2016-04-18 ENCOUNTER — Other Ambulatory Visit: Payer: Self-pay

## 2016-04-18 DIAGNOSIS — G47411 Narcolepsy with cataplexy: Secondary | ICD-10-CM

## 2016-04-18 MED ORDER — ARMODAFINIL 250 MG PO TABS: 250.0000 mg | ORAL_TABLET | Freq: Every day | ORAL | Status: AC

## 2016-04-18 NOTE — Telephone Encounter (Signed)
Faxed RX for armodafinil to [facility]. Received a receipt of confirmation.

## 2016-11-02 ENCOUNTER — Other Ambulatory Visit: Payer: Self-pay | Admitting: Neurology

## 2016-11-02 DIAGNOSIS — G47411 Narcolepsy with cataplexy: Secondary | ICD-10-CM
